# Patient Record
Sex: Female | Born: 1955 | Race: Black or African American | Hispanic: No | Marital: Married | Smoking: Former smoker
Health system: Southern US, Community
[De-identification: ages and names within clinical notes are randomized; demographics above are authoritative.]

## PROBLEM LIST (undated history)

## (undated) ENCOUNTER — Emergency Department (HOSPITAL_BASED_OUTPATIENT_CLINIC_OR_DEPARTMENT_OTHER): Admission: EM | Payer: BLUE CROSS/BLUE SHIELD | Source: Home / Self Care

## (undated) DIAGNOSIS — I2510 Atherosclerotic heart disease of native coronary artery without angina pectoris: Secondary | ICD-10-CM

## (undated) DIAGNOSIS — G4730 Sleep apnea, unspecified: Secondary | ICD-10-CM

## (undated) DIAGNOSIS — J449 Chronic obstructive pulmonary disease, unspecified: Secondary | ICD-10-CM

## (undated) DIAGNOSIS — I10 Essential (primary) hypertension: Secondary | ICD-10-CM

## (undated) HISTORY — PX: LUNG LOBECTOMY: SHX167

## (undated) HISTORY — PX: CORONARY ANGIOPLASTY: SHX604

---

## 2010-06-12 ENCOUNTER — Ambulatory Visit: Payer: Self-pay | Admitting: Obstetrics and Gynecology

## 2011-06-30 ENCOUNTER — Emergency Department (HOSPITAL_BASED_OUTPATIENT_CLINIC_OR_DEPARTMENT_OTHER)
Admission: EM | Admit: 2011-06-30 | Discharge: 2011-06-30 | Disposition: A | Discharge status: Home / Self Care | Payer: Self-pay | Attending: Emergency Medicine | Admitting: Emergency Medicine

## 2011-06-30 ENCOUNTER — Encounter: Payer: Self-pay | Admitting: *Deleted

## 2011-06-30 DIAGNOSIS — J45909 Unspecified asthma, uncomplicated: Secondary | ICD-10-CM | POA: Insufficient documentation

## 2011-06-30 DIAGNOSIS — E1169 Type 2 diabetes mellitus with other specified complication: Secondary | ICD-10-CM | POA: Insufficient documentation

## 2011-06-30 DIAGNOSIS — R739 Hyperglycemia, unspecified: Secondary | ICD-10-CM

## 2011-06-30 DIAGNOSIS — I2510 Atherosclerotic heart disease of native coronary artery without angina pectoris: Secondary | ICD-10-CM | POA: Insufficient documentation

## 2011-06-30 DIAGNOSIS — I10 Essential (primary) hypertension: Secondary | ICD-10-CM | POA: Insufficient documentation

## 2011-06-30 HISTORY — DX: Atherosclerotic heart disease of native coronary artery without angina pectoris: I25.10

## 2011-06-30 HISTORY — DX: Essential (primary) hypertension: I10

## 2011-06-30 LAB — GLUCOSE, CAPILLARY

## 2011-06-30 MED ORDER — INSULIN REGULAR HUMAN 100 UNIT/ML IJ SOLN
INTRAMUSCULAR | Status: AC
  Administered 2011-06-30: 7 [IU] via SUBCUTANEOUS
  Filled 2011-06-30: qty 3

## 2011-06-30 MED ORDER — INSULIN REGULAR HUMAN 100 UNIT/ML IJ SOLN
7.0000 [IU] | Freq: Once | INTRAMUSCULAR | Status: AC
  Administered 2011-06-30: 7 [IU] via SUBCUTANEOUS

## 2011-06-30 NOTE — ED Notes (Signed)
States all day today her blood sugar has been elevated. Lowest reading today was 399. Does not take insulin. Has an appointment with her MD in 3 days.

## 2011-06-30 NOTE — ED Provider Notes (Signed)
History     Chief Complaint  Patient presents with  . Hyperglycemia   Patient is a 55 y.o. female presenting with diabetes problem. The history is provided by the patient.  Diabetes She presents for her initial diabetic visit. She has type 1 diabetes mellitus. The initial diagnosis of diabetes was made 5 months ago. Her disease course has been worsening. There are no hypoglycemic associated symptoms. Associated symptoms include polyuria. (States also mild headache today) There are no hypoglycemic complications. Symptoms are stable. There are no diabetic complications. Current diabetic treatment includes oral agent (dual therapy). She is compliant with treatment all of the time. Her home blood glucose trend is increasing rapidly.  pt came today due to her blood sugar staying high all day and not going down.  States has never been in the 400's before.  However also states that she has been drinking haiwian punch and did not know it had sugar in it.  Past Medical History  Diagnosis Date  . Diabetes mellitus   . Hypertension   . Asthma   . Coronary artery disease     History reviewed. No pertinent past surgical history.  No family history on file.  History  Substance Use Topics  . Smoking status: Current Everyday Smoker -- 0.5 packs/day  . Smokeless tobacco: Not on file  . Alcohol Use: No    OB History    Grav Para Term Preterm Abortions TAB SAB Ect Mult Living                  Review of Systems  Genitourinary: Positive for polyuria.  All other systems reviewed and are negative.    Physical Exam  BP 121/78  Pulse 89  Resp 20  SpO2 95%  Physical Exam  Nursing note and vitals reviewed. Constitutional: She is oriented to person, place, and time. She appears well-developed and well-nourished. No distress.  HENT:  Head: Normocephalic and atraumatic.  Eyes: EOM are normal. Pupils are equal, round, and reactive to light.  Cardiovascular: Normal rate, regular rhythm, normal  heart sounds and intact distal pulses.  Exam reveals no friction rub.   No murmur heard. Pulmonary/Chest: Effort normal and breath sounds normal. She has no wheezes. She has no rales.  Abdominal: Soft. Bowel sounds are normal. She exhibits no distension. There is no tenderness. There is no rebound and no guarding.  Musculoskeletal: Normal range of motion. She exhibits no tenderness.       No edema  Neurological: She is alert and oriented to person, place, and time. No cranial nerve deficit.  Skin: Skin is warm and dry. No rash noted.  Psychiatric: She has a normal mood and affect. Her behavior is normal.    ED Course  Procedures  MDM Pt here due to having elevated BS today over 400 and states despite taking BS meds.  Pt denies any infectious sx, cough or SOB.  No abd pain or vomitting.  VS wnl.  BS here is 360 and when interviewing states that she has been drinking fruit punch which is probably why the BS is elevated today.  Discussed with pt that this has a lot of sugar and she was unaware.  States had a mild HA today but o/w no dysuria or other sx. Will give [place] insulin and recheck sugar.  Pt f/u with PCP in 3 days and may go on insulin at that time.    Repeat CBG improving and no 330.  Will d/d home and have  f/u.  Gwyneth Sprout, MD 06/30/11 [phone]

## 2012-03-13 ENCOUNTER — Emergency Department (HOSPITAL_BASED_OUTPATIENT_CLINIC_OR_DEPARTMENT_OTHER)
Admission: EM | Admit: 2012-03-13 | Discharge: 2012-03-13 | Disposition: A | Discharge status: Home / Self Care | Payer: Self-pay | Attending: Emergency Medicine | Admitting: Emergency Medicine

## 2012-03-13 ENCOUNTER — Other Ambulatory Visit: Payer: Self-pay

## 2012-03-13 ENCOUNTER — Encounter (HOSPITAL_BASED_OUTPATIENT_CLINIC_OR_DEPARTMENT_OTHER): Payer: Self-pay | Admitting: *Deleted

## 2012-03-13 ENCOUNTER — Emergency Department (INDEPENDENT_AMBULATORY_CARE_PROVIDER_SITE_OTHER): Payer: Self-pay

## 2012-03-13 DIAGNOSIS — R059 Cough, unspecified: Secondary | ICD-10-CM | POA: Insufficient documentation

## 2012-03-13 DIAGNOSIS — J45909 Unspecified asthma, uncomplicated: Secondary | ICD-10-CM | POA: Insufficient documentation

## 2012-03-13 DIAGNOSIS — R079 Chest pain, unspecified: Secondary | ICD-10-CM

## 2012-03-13 DIAGNOSIS — R05 Cough: Secondary | ICD-10-CM | POA: Insufficient documentation

## 2012-03-13 DIAGNOSIS — I2510 Atherosclerotic heart disease of native coronary artery without angina pectoris: Secondary | ICD-10-CM | POA: Insufficient documentation

## 2012-03-13 DIAGNOSIS — R0602 Shortness of breath: Secondary | ICD-10-CM

## 2012-03-13 DIAGNOSIS — R911 Solitary pulmonary nodule: Secondary | ICD-10-CM

## 2012-03-13 DIAGNOSIS — R110 Nausea: Secondary | ICD-10-CM | POA: Insufficient documentation

## 2012-03-13 DIAGNOSIS — E119 Type 2 diabetes mellitus without complications: Secondary | ICD-10-CM | POA: Insufficient documentation

## 2012-03-13 DIAGNOSIS — C3490 Malignant neoplasm of unspecified part of unspecified bronchus or lung: Secondary | ICD-10-CM

## 2012-03-13 DIAGNOSIS — R071 Chest pain on breathing: Secondary | ICD-10-CM | POA: Insufficient documentation

## 2012-03-13 DIAGNOSIS — I10 Essential (primary) hypertension: Secondary | ICD-10-CM | POA: Insufficient documentation

## 2012-03-13 DIAGNOSIS — R51 Headache: Secondary | ICD-10-CM

## 2012-03-13 DIAGNOSIS — F17200 Nicotine dependence, unspecified, uncomplicated: Secondary | ICD-10-CM | POA: Insufficient documentation

## 2012-03-13 LAB — DIFFERENTIAL
Basophils Relative: 0 % (ref 0–1)
Eosinophils Absolute: 0.2 10*3/uL (ref 0.0–0.7)
Eosinophils Relative: 2 % (ref 0–5)
Monocytes Absolute: 0.6 10*3/uL (ref 0.1–1.0)
Monocytes Relative: 6 % (ref 3–12)
Neutro Abs: 5.2 10*3/uL (ref 1.7–7.7)

## 2012-03-13 LAB — BASIC METABOLIC PANEL
BUN: 15 mg/dL (ref 6–23)
Chloride: 102 mEq/L (ref 96–112)
Creatinine, Ser: 0.7 mg/dL (ref 0.50–1.10)
GFR calc Af Amer: >90 mL/min (ref >90)

## 2012-03-13 LAB — CBC
HCT: 42.4 % (ref 36.0–46.0)
Hemoglobin: 13.8 g/dL (ref 12.0–15.0)
MCH: 30.1 pg (ref 26.0–34.0)
MCHC: 32.5 g/dL (ref 30.0–36.0)
MCV: 92.6 fL (ref 78.0–100.0)

## 2012-03-13 LAB — CARDIAC PANEL(CRET KIN+CKTOT+MB+TROPI): Relative Index: INVALID (ref 0.0–2.5)

## 2012-03-13 MED ORDER — SULFAMETHOXAZOLE-TRIMETHOPRIM 800-160 MG PO TABS: 1.0000 | ORAL_TABLET | Freq: Two times a day (BID) | ORAL | Status: AC

## 2012-03-13 MED ORDER — ONDANSETRON HCL 4 MG/2ML IJ SOLN
INTRAMUSCULAR | Status: AC
  Filled 2012-03-13: qty 2

## 2012-03-13 MED ORDER — ACETAMINOPHEN 325 MG PO TABS
650.0000 mg | ORAL_TABLET | Freq: Once | ORAL | Status: AC
  Administered 2012-03-13: 650 mg via ORAL
  Filled 2012-03-13: qty 2

## 2012-03-13 MED ORDER — IOHEXOL 350 MG/ML SOLN
100.0000 mL | Freq: Once | INTRAVENOUS | Status: AC | PRN
  Administered 2012-03-13: 100 mL via INTRAVENOUS

## 2012-03-13 MED ORDER — SODIUM CHLORIDE 0.9 % IV SOLN
8.0000 mg | Freq: Once | INTRAVENOUS | Status: DC
  Filled 2012-03-13: qty 2

## 2012-03-13 NOTE — ED Notes (Signed)
Explained delay to pt.  Lab reported to this RN earlier that BMP sample hemolyzed.  RN inadvertently did not re-draw lab.  CT on hold until creatinine resulted.  BMP re-drawn at this time.  NP also made aware.

## 2012-03-13 NOTE — ED Notes (Signed)
Pt states she began having CP last week, but it got worse last night. Radiates into back and she also had some nausea and SHOB. BBS diminished. Prod cough with clear mucous. Hx bronchitis

## 2012-03-13 NOTE — Discharge Instructions (Signed)
Cancer, General Information °Cancer is a group of many related diseases that begin in cells. Cells are the building blocks of the body. Cells are also the basic unit of life. The body is made up of many types of cells. Normally, cells grow and divide to produce more cells only when the body needs them. This orderly process helps keep the body healthy. Sometimes cells keep dividing when new cells are not needed. These extra cells may form a mass of tissue. It is called a growth or tumor. Tumors can be either: °· Not cancerous (benign).  °· Cancerous (malignant).  °Cancer can begin in any organ or tissue of the body. The original tumor is where the tumor started out. It is called the primary cancer or primary tumor. Usually it is named for the part of the body in which it begins. °Metastases mean the spread of cancer. Cancer cells can break away from a primary tumor and travel locally. These grow next to the tumor. Cancer cells can also travel through the bloodstream or lymphatic system to other parts of the body. Cancer cells may spread to lymph nodes near the primary tumor regional lymph nodes, so called because they are in the region. This is called: °· Nodal involvement.  °· Positive nodes.  °· Regional disease.  °Cancer cells can also spread to other parts of the body, distant from the primary tumor. Doctors use the term metastatic disease or distant disease to describe cancer that spreads to other organs or to lymph nodes other than those near the primary tumor. °When cancer cells spread and form a new tumor, the new tumor is called a secondary, or metastatic, tumor. The cancer cells that form the secondary tumor are like those in the original tumor. That means, for example, that if breast cancer spreads (metastasizes) to the lung, the secondary tumor is made up of abnormal breast cells. It is not made of abnormal lung cells. The disease in the lung is metastatic breast cancer. It is not lung cancer. °A  metastasis is a tumor that started from a cancer cell or cells in another part of the body. But sometimes a primary cancer is discovered only after a metastasis causes problems (symptoms). For example, a man whose prostate cancer has spread to the bones in the pelvis may have lower back pain (caused by the cancer in his bones) before experiencing any symptoms from the prostate tumor itself. °The cells in a metastatic tumor resemble those in the primary tumor. The cancerous tissue is examined under a microscope to determine the cell type. Then a doctor can usually tell whether that type of cell is normally found in the part of the body from which the tissue sample was taken. °For instance, breast cancer cells look the same whether they are found in the breast or have spread to another part of the body. So, if a tissue sample taken from a tumor in the lung contains cells that look like breast cells, the doctor determines that the lung tumor is a secondary tumor. Metastatic cancers may be found at the same time as the primary tumor, or months or years later. When a second tumor is found in a patient who has been treated for cancer in the past, it is more often a metastasis than another primary tumor. In a small number of cancer patients, a secondary tumor is diagnosed, but no primary cancer can be found, in spite of extensive tests. Doctors refer to the primary tumor as   unknown or occult. The patient is said tohave cancer of unknown primary origin (CUP). This means we do not know where the tumor came from. TREATMENT  When cancer has metastasized, it may be treated with:  Chemotherapy.   Radiation therapy.   Biologic therapy.   Hormone therapy.   Surgery.   A combination of these.  The choice of treatment generally depends on the:  Type of primary cancer.   Size and location of the metastasis.   Patient's age and general health.   Types of treatments used previously.  In patients diagnosed with  CUP, it is still possible to treat the disease even when the primary tumor cannot be located. It is also possible to treat the cancer even if the organ from which the cancer cells came cannot be identified. New cancer treatments are currently under study. To develop new treatments, the [employer] ([employer]) sponsors clinical trials (research studies) with cancer patients in many hospitals, universities, medical schools, and cancer centers around the country. Clinical trials are a critical step in the improvement of treatment. Before any new treatment can be recommended for general use, doctors conduct studies to find out whether the treatment is both safe for patients and effective against the disease. The results of such studies have led to progress not only in the treatment of cancer, but in the detection, diagnosis, improvement of survival rates, and prevention of the disease. Patients interested in participating in research should ask their doctor to find out whether they are eligible for a clinical trial. FOR MORE INFORMATION http://www.cancer.gov Document Released: 12/07/2005 Document Revised: 11/26/2011 Document Reviewed: 08/25/2007 [facility] Patient Information 2012 [place], [place].  Lung Cancer Lung cancer is a tumor which starts as a growth in your lungs. Cancer is a group of many related diseases that begin in cells, the building blocks of the body. Normally, cells grow and divide to produce more cells only when the body needs them. Sometimes cells keep dividing when new cells are not needed. These extra cells may form a mass of tissue called a growth or tumor. Tumors can be either benign (not cancerous) or malignant (cancerous). Cancer can begin in any organ or tissue of the body. The original tumor (where the tumor started out) is called the primary cancer and is usually named for where it begins.  Lung cancer is the most common cause of cancer death in men and women. There are several  different types of lung cancers. Usually, lung cancer is described as either small-cell lung cancer or non-small-cell lung cancer. Other types of cancer occur in the lungs, including carcinoid and cancers spread from other organs. The types of cancer have different behavior and treatment. CAUSES  This cancer usually starts when the lungs are exposed to harmful chemicals. When you quit smoking, your risk of lung cancer falls each year (but is never the same as a person who has never smoked).  Other risks include:   Radon gas exposure.   Asbestos and other industrial substance exposure.   Second hand tobacco smoke.   Air pollution.   Family or personal history of lung cancer.   Age over 31.  SYMPTOMS  Lung cancer can cause many symptoms. They depend on the type of cancer, its location and other factors. Symptoms of lung cancer can include:  Cough (either new, different or more severe).   Shortness of breath.   Coughing up blood (hemoptysis).   Chest pain.   Hoarseness.   Swelling of the  face.   Drooping eyelid.   Changes in blood tests: low sodium (hyponatremia), high calcium (hypercalcemia) or low blood count (anemia).   Weight loss.  In its early stages, lung cancer may not have symptoms and can be discovered by accident. Many of the symptoms above can be caused by diseases other than lung cancer. DIAGNOSIS  In early lung cancer, the patient often does not notice problems.  Common tests that help your caregiver diagnose your condition include:  Chest x-ray.   CT scan of the lungs and chest.   Blood tests.  If a tumor is found, a biopsy will be necessary to confirm that cancer is present and to determine the type of cancer. TREATMENT   Surgery offers a hope for a cure if the cancer has not spread and the cancer is not a small cell (oat cell) cancer of the lung. Surgery cannot cure the small cell type of cancer.   Radiation Therapy is a form of high energy X-ray that  helps slow or kill the cancer. It is often used along with medications (chemotherapy) to help treat the cancer and control pain.   Chemotherapy is used in combination with surgery in advanced cancer. It is also used in all small cell cancers.   Many new treatments look promising.   Your caregiver can give you more information and discuss treatment options that are best for your type of cancer.  HOME CARE INSTRUCTIONS   If you smoke, stop!   Take all medications as told.   Keep all appointments with your caregiver and other specialists.   Ask your caregiver if you should see a cancer specialist, if that has not been arranged.   If you require oxygen or breathing equipment, be sure you know how to use it and who to call with questions.   Follow any special diet directions. If you have problems with appetite, ask your caregiver for help.  SEEK MEDICAL CARE IF:   You have had a surgical procedure are you are having trouble recovering.   You have ongoing weight loss.   You have decreased strength or energy past the point when your caregiver said you would feel better.   You develop nausea or lightheadedness.   You have pain that is not improving.  SEEK IMMEDIATE MEDICAL CARE IF:   You cough up clotted blood or bright red blood.   Your pain is uncontrolled.   You develop new difficulty breathing or chest pain.   You develop swelling in one or both ankles or legs, or swelling in your face or neck.   You develop new headache or confusion.  Document Released: 03/15/2001 Document Revised: 11/26/2011 Document Reviewed: 12/24/2008 [facility] Patient Information 2012 [place], [place].   Call your provider at the adult health center tomorrow to schedule a follow-up appointment in the next two weeks  Bronchitis Bronchitis is the body's way of reacting to injury and/or infection (inflammation) of the bronchi. Bronchi are the air tubes that extend from the windpipe into the lungs. If the  inflammation becomes severe, it may cause shortness of breath. CAUSES  Inflammation may be caused by:  A virus.   Germs (bacteria).   Dust.   Allergens.   Pollutants and many other irritants.  The cells lining the bronchial tree are covered with tiny hairs (cilia). These constantly beat upward, away from the lungs, toward the mouth. This keeps the lungs free of pollutants. When these cells become too irritated and are unable to do  their job, mucus begins to develop. This causes the characteristic cough of bronchitis. The cough clears the lungs when the cilia are unable to do their job. Without either of these protective mechanisms, the mucus would settle in the lungs. Then you would develop pneumonia. Smoking is a common cause of bronchitis and can contribute to pneumonia. Stopping this habit is the single most important thing you can do to help yourself. TREATMENT   Your caregiver may prescribe an antibiotic if the cough is caused by bacteria. Also, medicines that open up your airways make it easier to breathe. Your caregiver may also recommend or prescribe an expectorant. It will loosen the mucus to be coughed up. Only take over-the-counter or prescription medicines for pain, discomfort, or fever as directed by your caregiver.   Removing whatever causes the problem (smoking, for example) is critical to preventing the problem from getting worse.   Cough suppressants may be prescribed for relief of cough symptoms.   Inhaled medicines may be prescribed to help with symptoms now and to help prevent problems from returning.   For those with recurrent (chronic) bronchitis, there may be a need for steroid medicines.  SEEK IMMEDIATE MEDICAL CARE IF:   During treatment, you develop more pus-like mucus (purulent sputum).   You have a fever.   Your baby is older than 3 months with a rectal temperature of 102 F (38.9 C) or higher.   Your baby is 47 months old or younger with a rectal  temperature of 100.4 F (38 C) or higher.   You become progressively more ill.   You have increased difficulty breathing, wheezing, or shortness of breath.  It is necessary to seek immediate medical care if you are elderly or sick from any other disease. MAKE SURE YOU:   Understand these instructions.   Will watch your condition.   Will get help right away if you are not doing well or get worse.  Document Released: 12/07/2005 Document Revised: 11/26/2011 Document Reviewed: 10/16/2008 [facility] Patient Information 2012 [place], [place].Marland Kitchen

## 2012-03-13 NOTE — ED Provider Notes (Signed)
Medical screening examination/treatment/procedure(s) were conducted as a shared visit with non-physician practitioner(s) and myself.  I personally evaluated the patient during the encounter  Patient seen by me chest x-ray very concerning for bronchogenic carcinoma patient does have followup available at [facility] she is going to call tomorrow if she runs into difficulty she will return here. Patient is further evaluation of this sometime over the next 2 weeks.  Shelda Jakes, MD 03/13/12 1754

## 2012-03-13 NOTE — ED Provider Notes (Signed)
History     CSN: [id number]  Arrival date & time 03/13/12  1255   First MD Initiated Contact with Patient 03/13/12 1412      Chief Complaint  Patient presents with  . Chest Pain    (Consider location/radiation/quality/duration/timing/severity/associated sxs/prior treatment) Patient is a 56 y.o. female presenting with chest pain. The history is provided by the patient. No language interpreter was used.  Chest Pain The chest pain began 5 - 7 days ago. Chest pain occurs intermittently. The chest pain is unchanged. The pain is associated with breathing and coughing. The severity of the pain is moderate. The quality of the pain is described as pleuritic. The pain radiates to the upper back. Chest pain is worsened by deep breathing. Primary symptoms include cough and nausea. Pertinent negatives for primary symptoms include no fever, no shortness of breath and no vomiting.  Pertinent negatives for associated symptoms include no orthopnea and no weakness. Risk factors include smoking/tobacco exposure.  Her past medical history is significant for diabetes.     Past Medical History  Diagnosis Date  . Diabetes mellitus   . Hypertension   . Asthma   . Coronary artery disease     History reviewed. No pertinent past surgical history.  History reviewed. No pertinent family history.  History  Substance Use Topics  . Smoking status: Current Everyday Smoker -- 0.5 packs/day  . Smokeless tobacco: Not on file  . Alcohol Use: No    OB History    Grav Para Term Preterm Abortions TAB SAB Ect Mult Living                  Review of Systems  Constitutional: Negative for fever.  Respiratory: Positive for cough. Negative for shortness of breath.   Cardiovascular: Positive for chest pain. Negative for orthopnea.  Gastrointestinal: Positive for nausea. Negative for vomiting.  Neurological: Negative for weakness.  All other systems reviewed and are negative.    Allergies  Review of  patient's allergies indicates no known allergies.  Home Medications   Current Outpatient Rx  Name Route Sig Dispense Refill  . ASPIRIN 81 MG PO TBEC Oral Take 81 mg by mouth daily.      [place] IN Inhalation Inhale 2 puffs into the lungs as needed. For shortness of breath     . CARVEDILOL PO Oral Take 1 tablet by mouth daily.      Marland Kitchen NEXIUM PO Oral Take 1 capsule by mouth daily.      Marland Kitchen GLIPIZIDE PO Oral Take 1 tablet by mouth daily.      Marland Kitchen LISINOPRIL PO Oral Take 1 tablet by mouth daily.      Marland Kitchen METFORMIN HCL PO Oral Take 2 tablets by mouth daily.        BP 142/82  Pulse 90  Temp(Src) 97.8 F (36.6 C) (Oral)  Resp 20  Ht 5\' 9"  (1.753 m)  Wt 222 lb (100.699 kg)  BMI 32.78 kg/m2  SpO2 95%  Physical Exam  Nursing note and vitals reviewed. Constitutional: She is oriented to person, place, and time. She appears well-developed and well-nourished. No distress.  HENT:  Head: Normocephalic.  Mouth/Throat: Oropharynx is clear and moist.  Eyes: Conjunctivae are normal. Pupils are equal, round, and reactive to light.  Neck: Normal range of motion. Neck supple.  Cardiovascular: Normal rate, regular rhythm, normal heart sounds and intact distal pulses.   Pulmonary/Chest: Effort normal.  Abdominal: Soft. Bowel sounds are normal.  Musculoskeletal: Normal range of  motion.  Lymphadenopathy:    She has no cervical adenopathy.  Neurological: She is alert and oriented to person, place, and time.  Skin: Skin is warm and dry.  Psychiatric: She has a normal mood and affect. Her behavior is normal. Judgment and thought content normal.    ED Course  Procedures (including critical care time)  Date: 03/13/2012  Rate: 84  Rhythm: normal sinus rhythm  QRS Axis: normal  Intervals: normal  ST/T Wave abnormalities: normal  Conduction Disutrbances:none  Narrative Interpretation:  NSR without ectopy--no ischemic changes  Old EKG Reviewed: none available   Labs Reviewed  DIFFERENTIAL -  Abnormal; Notable for the following:    Lymphs Abs 4.2 (*)    All other components within normal limits  CBC  BASIC METABOLIC PANEL  CARDIAC PANEL(CRET KIN+CKTOT+MB+TROPI)  BASIC METABOLIC PANEL  CARDIAC PANEL(CRET KIN+CKTOT+MB+TROPI)   No results found.   No diagnosis found.  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  Results discussed with patient and family.  Patient is followed by the adult health clinic in [facility].  Patient will call tomorrow to establish follow-up within the next two weeks.    Chronic cough. Likely d/t bronchitis.  Will treat with bactrim.  MDM          Jimmye Norman, NP 03/13/12 [phone]

## 2012-03-24 NOTE — ED Provider Notes (Signed)
Medical screening examination/treatment/procedure(s) were conducted as a shared visit with non-physician practitioner(s) and myself.  I personally evaluated the patient during the encounter  Shelda Jakes, MD 03/24/12 [phone]

## 2016-10-13 ENCOUNTER — Emergency Department (HOSPITAL_COMMUNITY): Payer: BLUE CROSS/BLUE SHIELD

## 2016-10-13 ENCOUNTER — Inpatient Hospital Stay (HOSPITAL_COMMUNITY)
Admission: EM | Admit: 2016-10-13 | Discharge: 2016-10-22 | DRG: 286 | Disposition: A | Discharge status: Home / Self Care | Payer: BLUE CROSS/BLUE SHIELD | Attending: Internal Medicine | Admitting: Internal Medicine

## 2016-10-13 ENCOUNTER — Encounter (HOSPITAL_COMMUNITY): Payer: Self-pay | Admitting: *Deleted

## 2016-10-13 DIAGNOSIS — J441 Chronic obstructive pulmonary disease with (acute) exacerbation: Secondary | ICD-10-CM | POA: Diagnosis present

## 2016-10-13 DIAGNOSIS — I2609 Other pulmonary embolism with acute cor pulmonale: Secondary | ICD-10-CM

## 2016-10-13 DIAGNOSIS — I272 Pulmonary hypertension, unspecified: Secondary | ICD-10-CM

## 2016-10-13 DIAGNOSIS — I110 Hypertensive heart disease with heart failure: Principal | ICD-10-CM | POA: Diagnosis present

## 2016-10-13 DIAGNOSIS — Z7982 Long term (current) use of aspirin: Secondary | ICD-10-CM

## 2016-10-13 DIAGNOSIS — K259 Gastric ulcer, unspecified as acute or chronic, without hemorrhage or perforation: Secondary | ICD-10-CM | POA: Diagnosis present

## 2016-10-13 DIAGNOSIS — Z6836 Body mass index (BMI) 36.0-36.9, adult: Secondary | ICD-10-CM

## 2016-10-13 DIAGNOSIS — R918 Other nonspecific abnormal finding of lung field: Secondary | ICD-10-CM

## 2016-10-13 DIAGNOSIS — I2721 Secondary pulmonary arterial hypertension: Secondary | ICD-10-CM | POA: Diagnosis present

## 2016-10-13 DIAGNOSIS — G8929 Other chronic pain: Secondary | ICD-10-CM | POA: Diagnosis present

## 2016-10-13 DIAGNOSIS — I2781 Cor pulmonale (chronic): Secondary | ICD-10-CM | POA: Diagnosis present

## 2016-10-13 DIAGNOSIS — E119 Type 2 diabetes mellitus without complications: Secondary | ICD-10-CM

## 2016-10-13 DIAGNOSIS — R0902 Hypoxemia: Secondary | ICD-10-CM

## 2016-10-13 DIAGNOSIS — E785 Hyperlipidemia, unspecified: Secondary | ICD-10-CM | POA: Diagnosis present

## 2016-10-13 DIAGNOSIS — I10 Essential (primary) hypertension: Secondary | ICD-10-CM | POA: Diagnosis present

## 2016-10-13 DIAGNOSIS — E662 Morbid (severe) obesity with alveolar hypoventilation: Secondary | ICD-10-CM | POA: Diagnosis present

## 2016-10-13 DIAGNOSIS — Z809 Family history of malignant neoplasm, unspecified: Secondary | ICD-10-CM

## 2016-10-13 DIAGNOSIS — I5082 Biventricular heart failure: Secondary | ICD-10-CM | POA: Diagnosis present

## 2016-10-13 DIAGNOSIS — E1169 Type 2 diabetes mellitus with other specified complication: Secondary | ICD-10-CM | POA: Diagnosis present

## 2016-10-13 DIAGNOSIS — J449 Chronic obstructive pulmonary disease, unspecified: Secondary | ICD-10-CM

## 2016-10-13 DIAGNOSIS — J9621 Acute and chronic respiratory failure with hypoxia: Secondary | ICD-10-CM | POA: Diagnosis present

## 2016-10-13 DIAGNOSIS — IMO0001 Reserved for inherently not codable concepts without codable children: Secondary | ICD-10-CM

## 2016-10-13 DIAGNOSIS — I5031 Acute diastolic (congestive) heart failure: Secondary | ICD-10-CM | POA: Diagnosis present

## 2016-10-13 DIAGNOSIS — I2510 Atherosclerotic heart disease of native coronary artery without angina pectoris: Secondary | ICD-10-CM | POA: Diagnosis present

## 2016-10-13 DIAGNOSIS — F17210 Nicotine dependence, cigarettes, uncomplicated: Secondary | ICD-10-CM | POA: Diagnosis present

## 2016-10-13 DIAGNOSIS — R1084 Generalized abdominal pain: Secondary | ICD-10-CM | POA: Diagnosis not present

## 2016-10-13 DIAGNOSIS — K279 Peptic ulcer, site unspecified, unspecified as acute or chronic, without hemorrhage or perforation: Secondary | ICD-10-CM

## 2016-10-13 DIAGNOSIS — R109 Unspecified abdominal pain: Secondary | ICD-10-CM | POA: Diagnosis present

## 2016-10-13 DIAGNOSIS — Z9119 Patient's noncompliance with other medical treatment and regimen: Secondary | ICD-10-CM

## 2016-10-13 DIAGNOSIS — Z8249 Family history of ischemic heart disease and other diseases of the circulatory system: Secondary | ICD-10-CM

## 2016-10-13 DIAGNOSIS — I5033 Acute on chronic diastolic (congestive) heart failure: Secondary | ICD-10-CM | POA: Diagnosis present

## 2016-10-13 DIAGNOSIS — Z955 Presence of coronary angioplasty implant and graft: Secondary | ICD-10-CM

## 2016-10-13 DIAGNOSIS — I50810 Right heart failure, unspecified: Secondary | ICD-10-CM

## 2016-10-13 DIAGNOSIS — E1165 Type 2 diabetes mellitus with hyperglycemia: Secondary | ICD-10-CM | POA: Diagnosis present

## 2016-10-13 DIAGNOSIS — Z794 Long term (current) use of insulin: Secondary | ICD-10-CM

## 2016-10-13 DIAGNOSIS — G4733 Obstructive sleep apnea (adult) (pediatric): Secondary | ICD-10-CM

## 2016-10-13 DIAGNOSIS — Z599 Problem related to housing and economic circumstances, unspecified: Secondary | ICD-10-CM

## 2016-10-13 DIAGNOSIS — Z833 Family history of diabetes mellitus: Secondary | ICD-10-CM

## 2016-10-13 DIAGNOSIS — Z9981 Dependence on supplemental oxygen: Secondary | ICD-10-CM

## 2016-10-13 HISTORY — DX: Sleep apnea, unspecified: G47.30

## 2016-10-13 HISTORY — DX: Chronic obstructive pulmonary disease, unspecified: J44.9

## 2016-10-13 LAB — COMPREHENSIVE METABOLIC PANEL
ALT: 23 U/L (ref 14–54)
ANION GAP: 10 (ref 5–15)
AST: 23 U/L (ref 15–41)
Albumin: 3.1 g/dL — ABNORMAL LOW (ref 3.5–5.0)
Alkaline Phosphatase: 72 U/L (ref 38–126)
BILIRUBIN TOTAL: 0.7 mg/dL (ref 0.3–1.2)
BUN: 7 mg/dL (ref 6–20)
CO2: 24 mmol/L (ref 22–32)
Calcium: 9.1 mg/dL (ref 8.9–10.3)
Chloride: 105 mmol/L (ref 101–111)
Creatinine, Ser: 0.76 mg/dL (ref 0.44–1.00)
GFR calc Af Amer: >60 mL/min (ref >60)
Glucose, Bld: 213 mg/dL — ABNORMAL HIGH (ref 65–99)
POTASSIUM: 4.1 mmol/L (ref 3.5–5.1)
Sodium: 139 mmol/L (ref 135–145)
TOTAL PROTEIN: 6.3 g/dL — AB (ref 6.5–8.1)

## 2016-10-13 LAB — CBC
HEMATOCRIT: 40.7 % (ref 36.0–46.0)
HEMOGLOBIN: 12.1 g/dL (ref 12.0–15.0)
MCH: 26.2 pg (ref 26.0–34.0)
MCHC: 29.7 g/dL — ABNORMAL LOW (ref 30.0–36.0)
MCV: 88.1 fL (ref 78.0–100.0)
Platelets: 254 10*3/uL (ref 150–400)
RBC: 4.62 MIL/uL (ref 3.87–5.11)
RDW: 18.3 % — AB (ref 11.5–15.5)
WBC: 9.9 10*3/uL (ref 4.0–10.5)

## 2016-10-13 LAB — I-STAT ARTERIAL BLOOD GAS, ED
Acid-Base Excess: 2 mmol/L (ref 0.0–2.0)
BICARBONATE: 27.2 mmol/L (ref 20.0–28.0)
O2 SAT: 90 %
PCO2 ART: 45.3 mmHg (ref 32.0–48.0)
TCO2: 29 mmol/L (ref 0–100)
pH, Arterial: 7.389 (ref 7.350–7.450)
pO2, Arterial: 60 mmHg — ABNORMAL LOW (ref 83.0–108.0)

## 2016-10-13 LAB — I-STAT TROPONIN, ED: Troponin i, poc: 0 ng/mL (ref 0.00–0.08)

## 2016-10-13 LAB — LIPASE, BLOOD: Lipase: 22 U/L (ref 11–51)

## 2016-10-13 LAB — D-DIMER, QUANTITATIVE: D-Dimer, Quant: 1.15 ug/mL-FEU — ABNORMAL HIGH (ref 0.00–0.50)

## 2016-10-13 LAB — BRAIN NATRIURETIC PEPTIDE: B NATRIURETIC PEPTIDE 5: 328.2 pg/mL — AB (ref 0.0–100.0)

## 2016-10-13 MED ORDER — FUROSEMIDE 10 MG/ML IJ SOLN
40.0000 mg | Freq: Once | INTRAMUSCULAR | Status: AC
  Administered 2016-10-14: 40 mg via INTRAVENOUS
  Filled 2016-10-13: qty 4

## 2016-10-13 MED ORDER — LIDOCAINE VISCOUS 2 % MT SOLN
15.0000 mL | Freq: Once | OROMUCOSAL | Status: AC
  Administered 2016-10-13: 15 mL via OROMUCOSAL
  Filled 2016-10-13: qty 15

## 2016-10-13 MED ORDER — IOPAMIDOL (ISOVUE-370) INJECTION 76%
INTRAVENOUS | Status: AC
  Administered 2016-10-13: 100 mL
  Filled 2016-10-13: qty 100

## 2016-10-13 MED ORDER — GI COCKTAIL ~~LOC~~
30.0000 mL | Freq: Once | ORAL | Status: AC
  Administered 2016-10-13: 30 mL via ORAL
  Filled 2016-10-13: qty 30

## 2016-10-13 MED ORDER — IPRATROPIUM-ALBUTEROL 0.5-2.5 (3) MG/3ML IN SOLN
3.0000 mL | RESPIRATORY_TRACT | Status: DC | PRN
  Administered 2016-10-13: 3 mL via RESPIRATORY_TRACT
  Filled 2016-10-13: qty 3

## 2016-10-13 NOTE — ED Provider Notes (Signed)
[facility] DEPT Provider Note   CSN: [id number] Arrival date & time: 10/13/16  1643     History   Chief Complaint Chief Complaint  Patient presents with  . Abdominal Pain    HPI Nancy Castillo is a 60 y.o. female with a hx of severe COPD on 4L of O2 by [place] at all times, CAD, DM, HTN, and chronic abdominal pain presents to the ED noting 9-10 months who has had recent EGD and was found to have ulcers, taking carafate and omeprazole to no avail of her sx. She notes no nausea, vomiting, diarrhea, or blood in her stool with it. She presented to the ED for further evaluation of her symptoms. She denies any new aspects of her sx, only persistence.   Additionally, the patient states that her breathing feels a bit shorter than it normally does. She denies any productive cough, fever, chills, chest pain, chest tightness. She states that she has been compliant with her rx'd medications.    HPI  Past Medical History:  Diagnosis Date  . Asthma   . Coronary artery disease   . Diabetes mellitus   . Hypertension     Patient Active Problem List   Diagnosis Date Noted  . Respiratory failure with hypoxia ([place]) 10/14/2016    History reviewed. No pertinent surgical history.  OB History    No data available       Home Medications    Prior to Admission medications   Medication Sig Start Date End Date Taking? Authorizing Provider  albuterol (PROVENTIL HFA;VENTOLIN HFA) 108 (90 Base) MCG/ACT inhaler Inhale 2 puffs into the lungs every 4 (four) hours as needed for wheezing or shortness of breath.  08/04/16  Yes Historical Provider, MD  aspirin 81 MG EC tablet Take 81 mg by mouth daily.     Yes Historical Provider, MD  carvedilol (COREG) 3.125 MG tablet Take 3.125 mg by mouth 2 (two) times daily with a meal.   Yes Historical Provider, MD  Insulin Detemir ([name] FLEXPEN) 100 UNIT/ML Pen Inject 50 Units into the skin 2 (two) times daily.   Yes Historical Provider, MD  insulin lispro  (HUMALOG KWIKPEN) 100 UNIT/ML KiwkPen Inject 20 Units into the skin 2 (two) times daily.   Yes Historical Provider, MD  lactulose, encephalopathy, (GENERLAC) 10 GM/15ML SOLN Take 10 g by mouth 2 (two) times daily as needed (for constipation).   Yes Historical Provider, MD  linaclotide (LINZESS) 290 MCG CAPS capsule Take 290 mcg by mouth daily before breakfast.   Yes Historical Provider, MD  lisinopril (PRINIVIL,ZESTRIL) 5 MG tablet Take 5 mg by mouth daily.   Yes Historical Provider, MD  omeprazole (PRILOSEC) 40 MG capsule Take 40 mg by mouth daily.   Yes Historical Provider, MD  oxyCODONE-acetaminophen (PERCOCET/ROXICET) 5-325 MG tablet Take 0.5-1 tablets by mouth every 8 (eight) hours as needed (for pain).    Yes Historical Provider, MD  pantoprazole (PROTONIX) 40 MG tablet Take 40 mg by mouth daily. 10/04/16 11/03/16 Yes Historical Provider, MD  polyethylene glycol (MIRALAX / GLYCOLAX) packet Take 17 g by mouth daily as needed for constipation. 10/04/16 11/03/16 Yes Historical Provider, MD  simethicone (MYLICON) 80 MG chewable tablet Chew 80 mg by mouth every 6 (six) hours as needed for flatulence. 10/04/16 11/03/16 Yes Historical Provider, MD  simvastatin (ZOCOR) 40 MG tablet Take 40 mg by mouth daily.   Yes Historical Provider, MD  sitaGLIPtin-metformin ([name]) 50-1000 MG tablet Take 1 tablet by mouth every morning.   Yes  Historical Provider, MD  sucralfate (CARAFATE) 1 g tablet Take 1 g by mouth 3 (three) times daily.    Yes Historical Provider, MD  Vitamin D, Ergocalciferol, (DRISDOL) 50000 units CAPS capsule Take 50,000 Units by mouth every 7 (seven) days.   Yes Historical Provider, MD  Fluticasone-Salmeterol (ADVAIR DISKUS) 250-50 MCG/DOSE AEPB Inhale 1 puff into the lungs 2 (two) times daily.    Historical Provider, MD  furosemide (LASIX) 20 MG tablet Take 20 mg by mouth daily as needed for fluid or edema.    Historical Provider, MD  umeclidinium bromide (INCRUSE ELLIPTA) 62.5 MCG/INH AEPB  Inhale 1 puff into the lungs daily. 10/04/16 11/03/16  Historical Provider, MD    Family History History reviewed. No pertinent family history.  Social History Social History  Substance Use Topics  . Smoking status: Current Every Day Smoker    Packs/day: 0.50  . Smokeless tobacco: Not on file  . Alcohol use No     Allergies   Review of patient's allergies indicates no known allergies.   Review of Systems Review of Systems  Constitutional: Positive for activity change. Negative for appetite change, chills and fever.  HENT: Negative for congestion, rhinorrhea, sinus pressure, sneezing and sore throat.   Respiratory: Positive for shortness of breath. Negative for cough, chest tightness and wheezing.   Cardiovascular: Negative for chest pain and leg swelling.  Gastrointestinal: Positive for abdominal pain. Negative for diarrhea, nausea and vomiting.  Genitourinary: Negative for dysuria, frequency and urgency.  Musculoskeletal: Negative for back pain and neck pain.  Skin: Negative for rash.  Neurological: Negative for dizziness, syncope, light-headedness and headaches.  All other systems reviewed and are negative.    Physical Exam Updated Vital Signs BP 129/90   Pulse 113   Temp 99.5 F (37.5 C) (Rectal)   Resp 26   SpO2 95%   Physical Exam  Constitutional: She is oriented to person, place, and time. She appears well-developed and well-nourished. No distress.  HENT:  Head: Normocephalic and atraumatic.  Nose: Nose normal.  Mouth/Throat: Oropharynx is clear and moist.  Eyes: Conjunctivae and EOM are normal. Pupils are equal, round, and reactive to light.  Neck: Neck supple.  Cardiovascular: Regular rhythm, normal heart sounds and intact distal pulses.  Tachycardia present.   Pulmonary/Chest: Effort normal. She has rales.  Abdominal: Soft. She exhibits no distension. There is no tenderness. There is no rebound and no guarding.  Musculoskeletal: She exhibits no edema or  tenderness.  Neurological: She is alert and oriented to person, place, and time. No cranial nerve deficit. Coordination normal.  Skin: Skin is warm and dry. No rash noted. She is not diaphoretic.  Nursing note and vitals reviewed.    ED Treatments / Results  Labs (all labs ordered are listed, but only abnormal results are displayed) Labs Reviewed  COMPREHENSIVE METABOLIC PANEL - Abnormal; Notable for the following:       Result Value   Glucose, Bld 213 (*)    Total Protein 6.3 (*)    Albumin 3.1 (*)    All other components within normal limits  CBC - Abnormal; Notable for the following:    MCHC 29.7 (*)    RDW 18.3 (*)    All other components within normal limits  BRAIN NATRIURETIC PEPTIDE - Abnormal; Notable for the following:    B Natriuretic Peptide 328.2 (*)    All other components within normal limits  D-DIMER, QUANTITATIVE (NOT AT [facility]) - Abnormal; Notable for the following:  D-Dimer, Quant 1.15 (*)    All other components within normal limits  I-STAT ARTERIAL BLOOD GAS, ED - Abnormal; Notable for the following:    pO2, Arterial 60.0 (*)    All other components within normal limits  LIPASE, BLOOD  URINALYSIS, ROUTINE W REFLEX MICROSCOPIC (NOT AT [facility])  I-STAT TROPOININ, ED    EKG  EKG Interpretation  Date/Time:  Tuesday October 13 2016 17:09:20 EDT Ventricular Rate:  106 PR Interval:    QRS Duration: 92 QT Interval:  363 QTC Calculation: 482 R Axis:   12 Text Interpretation:  Sinus tachycardia Borderline T abnormalities, anterior leads SINCE LAST TRACING HEART RATE HAS INCREASED Confirmed by Winfred Leeds  MD, SAM [phone]) on 10/13/2016 6:16:09 PM       Radiology Ct Angio Chest Pe W And/or Wo Contrast  Result Date: 10/13/2016 CLINICAL DATA:  Initial evaluation for increased oxygen demand, hypoxia. EXAM: CT ANGIOGRAPHY CHEST WITH CONTRAST TECHNIQUE: Multidetector CT imaging of the chest was performed using the standard protocol during bolus administration of  intravenous contrast. Multiplanar CT image reconstructions and MIPs were obtained to evaluate the vascular anatomy. CONTRAST:  90 cc of Isovue 370. COMPARISON:  Prior CT from 09/20/2016 as well as previous radiograph from earlier same day. FINDINGS: Cardiovascular: Intrathoracic aorta a grossly normal and of normal caliber. No definite aortic abnormality, although evaluation limited due to timing of the contrast bolus. Cardiomegaly is stable from prior. Right ventricular and right atrial enlargement with straightening of the intraventricular septum. No pericardial effusion. Pulmonary arterial tree adequately opacified for evaluation. Main pulmonary artery mildly dilated to 3.2 cm and transaxial diameter. No filling defect to suggest acute pulmonary embolism identified. Re-formatted imaging confirms these findings. From the data bowing for possible small distal emboli somewhat limited on CT moved respiratory motion artifact and body habitus. Mediastinum/Nodes: Visualized thyroid is normal. Scattered enlarged mediastinal lymph nodes measure up to approximately 2 cm in short access, not significantly changed relative to recent CT. Prevascular node measures 15 mm, stable. Right hilar node measures 14 mm. Soft tissue fullness at the left hilum with 16 mm left hilar node also similar. Mildly prominent left axillary node measures up to 11 mm, similar to previous. No right axillary adenopathy. Esophagus within normal limits. Lungs/Pleura: Small layering bilateral pleural effusions. Diffuse interlobular septal thickening suggestive of pulmonary edema. 5 mm right upper lobe nodule is stable (series 6, image 32). Additional 10 x 9 mm nodular density within the posterior left upper lobe also stable (series 6, image 34). Unchanged 5 mm subpleural left upper lobe nodule (series 6, image 42). No focal infiltrates. Linear atelectasis within the right middle lobe and lingula. No pneumothorax. Upper Abdomen: Visualized upper abdomen  within normal limits. Musculoskeletal: No acute osseous abnormality. No worrisome lytic or blastic osseous lesions. Review of the MIP images confirms the above findings. IMPRESSION: 1. No CT evidence for acute pulmonary embolism. 2. Diffuse interlobular septal thickening, suggesting mild pulmonary interstitial edema. Small layering bilateral pleural effusions. 3. Dilatation of the main pulmonary artery to 3.2 cm, suggesting pulmonary hypertension. Right ventricular and right atrial dilatation with mild straightening of the intraventricular septum, suggestive of elevated right sided heart pressures. Findings are similar relative to recent CT. 4. Similar appearance of enlarged mediastinal, hilar, and left axillary lymph nodes as above. Again, while these findings may be related to underlying systemic disease such as sarcoidosis, possible lymphoproliferative disorder could also have this appearance. 5. Bilateral pulmonary nodules, stable relative to previous examinations, presumably benign. Electronically Signed  By: Jeannine Boga M.D.   On: 10/13/2016 22:24   Dg Chest Portable 1 View  Result Date: 10/13/2016 CLINICAL DATA:  60 year old female with history of acute on chronic shortness of breath today. Low oxygen saturations. EXAM: PORTABLE CHEST 1 VIEW COMPARISON:  Chest x-ray 09/30/2016. FINDINGS: There is cephalization of the pulmonary vasculature and slight indistinctness of the interstitial markings suggestive of mild pulmonary edema. Mild cardiomegaly trace bilateral pleural effusions. The patient is rotated to the right on today's exam, resulting in distortion of the mediastinal contours and reduced diagnostic sensitivity and specificity for mediastinal pathology. Atherosclerosis in the thoracic aorta. IMPRESSION: 1. The appearance of the chest suggests mild congestive heart failure, as above. Electronically Signed   By: Vinnie Langton M.D.   On: 10/13/2016 17:44    Procedures Procedures  (including critical care time)  Medications Ordered in ED Medications  ipratropium-albuterol (DUONEB) 0.5-2.5 (3) MG/3ML nebulizer solution 3 mL (3 mLs Nebulization Given 10/13/16 1838)  gi cocktail (Maalox,Lidocaine,Donnatal) (30 mLs Oral Given 10/13/16 1836)  lidocaine (XYLOCAINE) 2 % viscous mouth solution 15 mL (15 mLs Mouth/Throat Given 10/13/16 1836)  iopamidol (ISOVUE-370) 76 % injection (100 mLs  Contrast Given 10/13/16 2109)  furosemide (LASIX) injection 40 mg (40 mg Intravenous Given 10/14/16 0020)     Initial Impression / Assessment and Plan / ED Course  I have reviewed the triage vital signs and the nursing notes.  Pertinent labs & imaging results that were available during my care of the patient were reviewed by me and considered in my medical decision making (see chart for details).  Clinical Course   60 y.o. female presents with chronic abdominal pain and acute on chronic SOB. She initially was significantly hypoxic satting in the 70s on her normal 4 L of oxygen by nasal cannula.. She is placed on 6 L and improved up to the high 80s, waxing and waning intermittently getting up to 90%. The patient remained stable and well-appearing throughout speaking in full sentences with no respiratory distress noted.   Labs returned showing significant hypoxia on ABG with PO2 of 60, but otherwise normal blood gas.   Chest x-ray shows mild congestive heart failure. BNP mildly elevated at 328.2. She was given 40 mg of Lasix.  D-dimer was positive at 1.15, so CT PE study was performed and showed no evidence of acute PE but did show evidence of edema and pleural effusions. CT findings evidence of pulmonary hypertension, similar to previous.  On reassessment the patient was found to be persistently satting in the mid 80s despite 6 L by nasal cannula so she is placed on nonrebreather and her oxygen saturation was brought up to the low to mid 90s.   Abdominal exam benign with no tenderness to  palpation, guarding, or other peritoneal signs. Given chronicity and reassuring exam with reassuring CBC, CMP, normal lipase, do not feel that advanced imaging is necessary at this time. Feel this is chronic abdominal pain, associated with PUD. She was given a dose of GI cocktail in the ED.   Given increased oxygen demand the decision was made to admit to the hospitalist stepdown unit for further care and assessment. Feel this is likely a acute on chronic exacerbation of CHF, pulmonary hypertension. Low suspicion for infectious etiology at this time.    Final Clinical Impressions(s) / ED Diagnoses   Final diagnoses:  Hypoxia  Chronic abdominal pain  PUD (peptic ulcer disease)    New Prescriptions New Prescriptions   No medications on file  Zenovia Jarred, DO 10/14/16 [id number]    Orlie Dakin, MD 10/15/16 1332

## 2016-10-13 NOTE — ED Notes (Signed)
Flap removed from NRB mask due to improved sats of 96% per RPT instructions

## 2016-10-13 NOTE — ED Notes (Signed)
IV team at bedside 

## 2016-10-13 NOTE — ED Triage Notes (Signed)
Pt c/o chronic abdominal pain. Pt denies n/v/d. Reports some constipation. Pt on 2 lpm home O2, upon arrival to ED pt's O2 sat 60%. Pt O2 take is not continuous. Pt switched to ED O2, sats increased to 89%. Pt in no obvious respiratory distress. Pt denies CP, states she is always short of breath.

## 2016-10-13 NOTE — ED Notes (Signed)
Report given to moe rn

## 2016-10-13 NOTE — ED Provider Notes (Addendum)
Patient complains of diffuse abdominal pain for the past 9 months becoming worse over the past 2 days. She reports that she's been told by her gastroenterologist that abdominal pain is a result of ulcers.. She also states that breathing has become worse over the past one or 2 days. She denies cough. Denies chest pain. On exam she appears in no distress lungs clear auscultation heart regular rate and rhythm abdomen obese, nontender extremities without edema. Patient's arterial blood gas noted to be remarkable for hypoxemia on 6 L of oxygen. Patient normally wears 4 L of home oxygen via nasal cannula Results for orders placed or performed during the hospital encounter of 10/13/16  Lipase, blood  Result Value Ref Range   Lipase 22 11 - 51 U/L  Comprehensive metabolic panel  Result Value Ref Range   Sodium 139 135 - 145 mmol/L   Potassium 4.1 3.5 - 5.1 mmol/L   Chloride 105 101 - 111 mmol/L   CO2 24 22 - 32 mmol/L   Glucose, Bld 213 (H) 65 - 99 mg/dL   BUN 7 6 - 20 mg/dL   Creatinine, Ser 0.76 0.44 - 1.00 mg/dL   Calcium 9.1 8.9 - 10.3 mg/dL   Total Protein 6.3 (L) 6.5 - 8.1 g/dL   Albumin 3.1 (L) 3.5 - 5.0 g/dL   AST 23 15 - 41 U/L   ALT 23 14 - 54 U/L   Alkaline Phosphatase 72 38 - 126 U/L   Total Bilirubin 0.7 0.3 - 1.2 mg/dL   GFR calc non Af Amer >60 >60 mL/min   GFR calc Af Amer >60 >60 mL/min   Anion gap 10 5 - 15  CBC  Result Value Ref Range   WBC 9.9 4.0 - 10.5 K/uL   RBC 4.62 3.87 - 5.11 MIL/uL   Hemoglobin 12.1 12.0 - 15.0 g/dL   HCT 40.7 36.0 - 46.0 %   MCV 88.1 78.0 - 100.0 fL   MCH 26.2 26.0 - 34.0 pg   MCHC 29.7 (L) 30.0 - 36.0 g/dL   RDW 18.3 (H) 11.5 - 15.5 %   Platelets 254 150 - 400 K/uL  Urinalysis, Routine w reflex microscopic  Result Value Ref Range   Color, Urine YELLOW YELLOW   APPearance CLEAR CLEAR   Specific Gravity, Urine 1.010 1.005 - 1.030   pH 5.5 5.0 - 8.0   Glucose, UA NEGATIVE NEGATIVE mg/dL   Hgb urine dipstick NEGATIVE NEGATIVE   Bilirubin  Urine NEGATIVE NEGATIVE   Ketones, ur NEGATIVE NEGATIVE mg/dL   Protein, ur NEGATIVE NEGATIVE mg/dL   Nitrite NEGATIVE NEGATIVE   Leukocytes, UA NEGATIVE NEGATIVE  Brain natriuretic peptide  Result Value Ref Range   B Natriuretic Peptide 328.2 (H) 0.0 - 100.0 pg/mL  D-dimer, quantitative (not at [facility])  Result Value Ref Range   D-Dimer, Quant 1.15 (H) 0.00 - 0.50 ug/mL-FEU  I-Stat Troponin, ED (not at [facility])  Result Value Ref Range   Troponin i, poc 0.00 0.00 - 0.08 ng/mL   Comment 3          I-Stat Arterial Blood Gas, ED - (order at [facility] and [facility] only)  Result Value Ref Range   pH, Arterial 7.389 7.350 - 7.450   pCO2 arterial 45.3 32.0 - 48.0 mmHg   pO2, Arterial 60.0 (L) 83.0 - 108.0 mmHg   Bicarbonate 27.2 20.0 - 28.0 mmol/L   TCO2 29 0 - 100 mmol/L   O2 Saturation 90.0 %   Acid-Base Excess 2.0 0.0 -  2.0 mmol/L   Patient temperature 99.5 F    Collection site BRACHIAL ARTERY    Drawn by RT    Sample type ARTERIAL    Ct Angio Chest Pe W And/or Wo Contrast  Result Date: 10/13/2016 CLINICAL DATA:  Initial evaluation for increased oxygen demand, hypoxia. EXAM: CT ANGIOGRAPHY CHEST WITH CONTRAST TECHNIQUE: Multidetector CT imaging of the chest was performed using the standard protocol during bolus administration of intravenous contrast. Multiplanar CT image reconstructions and MIPs were obtained to evaluate the vascular anatomy. CONTRAST:  90 cc of Isovue 370. COMPARISON:  Prior CT from 09/20/2016 as well as previous radiograph from earlier same day. FINDINGS: Cardiovascular: Intrathoracic aorta a grossly normal and of normal caliber. No definite aortic abnormality, although evaluation limited due to timing of the contrast bolus. Cardiomegaly is stable from prior. Right ventricular and right atrial enlargement with straightening of the intraventricular septum. No pericardial effusion. Pulmonary arterial tree adequately opacified for evaluation. Main pulmonary artery mildly dilated to 3.2 cm  and transaxial diameter. No filling defect to suggest acute pulmonary embolism identified. Re-formatted imaging confirms these findings. From the data bowing for possible small distal emboli somewhat limited on CT moved respiratory motion artifact and body habitus. Mediastinum/Nodes: Visualized thyroid is normal. Scattered enlarged mediastinal lymph nodes measure up to approximately 2 cm in short access, not significantly changed relative to recent CT. Prevascular node measures 15 mm, stable. Right hilar node measures 14 mm. Soft tissue fullness at the left hilum with 16 mm left hilar node also similar. Mildly prominent left axillary node measures up to 11 mm, similar to previous. No right axillary adenopathy. Esophagus within normal limits. Lungs/Pleura: Small layering bilateral pleural effusions. Diffuse interlobular septal thickening suggestive of pulmonary edema. 5 mm right upper lobe nodule is stable (series 6, image 32). Additional 10 x 9 mm nodular density within the posterior left upper lobe also stable (series 6, image 34). Unchanged 5 mm subpleural left upper lobe nodule (series 6, image 42). No focal infiltrates. Linear atelectasis within the right middle lobe and lingula. No pneumothorax. Upper Abdomen: Visualized upper abdomen within normal limits. Musculoskeletal: No acute osseous abnormality. No worrisome lytic or blastic osseous lesions. Review of the MIP images confirms the above findings. IMPRESSION: 1. No CT evidence for acute pulmonary embolism. 2. Diffuse interlobular septal thickening, suggesting mild pulmonary interstitial edema. Small layering bilateral pleural effusions. 3. Dilatation of the main pulmonary artery to 3.2 cm, suggesting pulmonary hypertension. Right ventricular and right atrial dilatation with mild straightening of the intraventricular septum, suggestive of elevated right sided heart pressures. Findings are similar relative to recent CT. 4. Similar appearance of enlarged  mediastinal, hilar, and left axillary lymph nodes as above. Again, while these findings may be related to underlying systemic disease such as sarcoidosis, possible lymphoproliferative disorder could also have this appearance. 5. Bilateral pulmonary nodules, stable relative to previous examinations, presumably benign. Electronically Signed   By: Jeannine Boga M.D.   On: 10/13/2016 22:24   Dg Chest Portable 1 View  Result Date: 10/13/2016 CLINICAL DATA:  60 year old female with history of acute on chronic shortness of breath today. Low oxygen saturations. EXAM: PORTABLE CHEST 1 VIEW COMPARISON:  Chest x-ray 09/30/2016. FINDINGS: There is cephalization of the pulmonary vasculature and slight indistinctness of the interstitial markings suggestive of mild pulmonary edema. Mild cardiomegaly trace bilateral pleural effusions. The patient is rotated to the right on today's exam, resulting in distortion of the mediastinal contours and reduced diagnostic sensitivity and specificity for mediastinal  pathology. Atherosclerosis in the thoracic aorta. IMPRESSION: 1. The appearance of the chest suggests mild congestive heart failure, as above. Electronically Signed   By: Vinnie Langton M.D.   On: 10/13/2016 17:44     Orlie Dakin, MD 10/14/16 [place], MD 10/14/16 [phone]

## 2016-10-14 ENCOUNTER — Encounter (HOSPITAL_COMMUNITY): Payer: Self-pay | Admitting: Internal Medicine

## 2016-10-14 ENCOUNTER — Inpatient Hospital Stay (HOSPITAL_COMMUNITY): Payer: BLUE CROSS/BLUE SHIELD

## 2016-10-14 DIAGNOSIS — J449 Chronic obstructive pulmonary disease, unspecified: Secondary | ICD-10-CM | POA: Diagnosis not present

## 2016-10-14 DIAGNOSIS — E662 Morbid (severe) obesity with alveolar hypoventilation: Secondary | ICD-10-CM | POA: Diagnosis present

## 2016-10-14 DIAGNOSIS — R1013 Epigastric pain: Secondary | ICD-10-CM | POA: Diagnosis not present

## 2016-10-14 DIAGNOSIS — E785 Hyperlipidemia, unspecified: Secondary | ICD-10-CM | POA: Diagnosis present

## 2016-10-14 DIAGNOSIS — I272 Pulmonary hypertension, unspecified: Secondary | ICD-10-CM | POA: Diagnosis present

## 2016-10-14 DIAGNOSIS — J9601 Acute respiratory failure with hypoxia: Secondary | ICD-10-CM | POA: Diagnosis not present

## 2016-10-14 DIAGNOSIS — G4733 Obstructive sleep apnea (adult) (pediatric): Secondary | ICD-10-CM | POA: Diagnosis not present

## 2016-10-14 DIAGNOSIS — I5033 Acute on chronic diastolic (congestive) heart failure: Secondary | ICD-10-CM | POA: Diagnosis present

## 2016-10-14 DIAGNOSIS — G8929 Other chronic pain: Secondary | ICD-10-CM | POA: Diagnosis present

## 2016-10-14 DIAGNOSIS — Z794 Long term (current) use of insulin: Secondary | ICD-10-CM | POA: Diagnosis not present

## 2016-10-14 DIAGNOSIS — J9621 Acute and chronic respiratory failure with hypoxia: Secondary | ICD-10-CM | POA: Diagnosis present

## 2016-10-14 DIAGNOSIS — Z9981 Dependence on supplemental oxygen: Secondary | ICD-10-CM | POA: Diagnosis not present

## 2016-10-14 DIAGNOSIS — I2609 Other pulmonary embolism with acute cor pulmonale: Secondary | ICD-10-CM | POA: Diagnosis not present

## 2016-10-14 DIAGNOSIS — K259 Gastric ulcer, unspecified as acute or chronic, without hemorrhage or perforation: Secondary | ICD-10-CM | POA: Diagnosis present

## 2016-10-14 DIAGNOSIS — E1169 Type 2 diabetes mellitus with other specified complication: Secondary | ICD-10-CM | POA: Diagnosis not present

## 2016-10-14 DIAGNOSIS — E089 Diabetes mellitus due to underlying condition without complications: Secondary | ICD-10-CM | POA: Diagnosis not present

## 2016-10-14 DIAGNOSIS — I2781 Cor pulmonale (chronic): Secondary | ICD-10-CM | POA: Diagnosis present

## 2016-10-14 DIAGNOSIS — Z8249 Family history of ischemic heart disease and other diseases of the circulatory system: Secondary | ICD-10-CM | POA: Diagnosis not present

## 2016-10-14 DIAGNOSIS — R1084 Generalized abdominal pain: Secondary | ICD-10-CM | POA: Diagnosis present

## 2016-10-14 DIAGNOSIS — Z955 Presence of coronary angioplasty implant and graft: Secondary | ICD-10-CM | POA: Diagnosis not present

## 2016-10-14 DIAGNOSIS — J441 Chronic obstructive pulmonary disease with (acute) exacerbation: Secondary | ICD-10-CM | POA: Diagnosis present

## 2016-10-14 DIAGNOSIS — Z833 Family history of diabetes mellitus: Secondary | ICD-10-CM | POA: Diagnosis not present

## 2016-10-14 DIAGNOSIS — K279 Peptic ulcer, site unspecified, unspecified as acute or chronic, without hemorrhage or perforation: Secondary | ICD-10-CM | POA: Diagnosis present

## 2016-10-14 DIAGNOSIS — Z599 Problem related to housing and economic circumstances, unspecified: Secondary | ICD-10-CM | POA: Diagnosis not present

## 2016-10-14 DIAGNOSIS — R109 Unspecified abdominal pain: Secondary | ICD-10-CM | POA: Diagnosis present

## 2016-10-14 DIAGNOSIS — I5031 Acute diastolic (congestive) heart failure: Secondary | ICD-10-CM | POA: Diagnosis not present

## 2016-10-14 DIAGNOSIS — Z809 Family history of malignant neoplasm, unspecified: Secondary | ICD-10-CM | POA: Diagnosis not present

## 2016-10-14 DIAGNOSIS — I2510 Atherosclerotic heart disease of native coronary artery without angina pectoris: Secondary | ICD-10-CM | POA: Diagnosis present

## 2016-10-14 DIAGNOSIS — Z7982 Long term (current) use of aspirin: Secondary | ICD-10-CM | POA: Diagnosis not present

## 2016-10-14 DIAGNOSIS — E119 Type 2 diabetes mellitus without complications: Secondary | ICD-10-CM | POA: Diagnosis not present

## 2016-10-14 DIAGNOSIS — I2721 Secondary pulmonary arterial hypertension: Secondary | ICD-10-CM | POA: Diagnosis present

## 2016-10-14 DIAGNOSIS — I110 Hypertensive heart disease with heart failure: Secondary | ICD-10-CM | POA: Diagnosis present

## 2016-10-14 DIAGNOSIS — Z6836 Body mass index (BMI) 36.0-36.9, adult: Secondary | ICD-10-CM | POA: Diagnosis not present

## 2016-10-14 DIAGNOSIS — E1165 Type 2 diabetes mellitus with hyperglycemia: Secondary | ICD-10-CM | POA: Diagnosis present

## 2016-10-14 DIAGNOSIS — I509 Heart failure, unspecified: Secondary | ICD-10-CM | POA: Diagnosis not present

## 2016-10-14 DIAGNOSIS — R0600 Dyspnea, unspecified: Secondary | ICD-10-CM | POA: Diagnosis not present

## 2016-10-14 DIAGNOSIS — Z9119 Patient's noncompliance with other medical treatment and regimen: Secondary | ICD-10-CM | POA: Diagnosis not present

## 2016-10-14 DIAGNOSIS — F17210 Nicotine dependence, cigarettes, uncomplicated: Secondary | ICD-10-CM | POA: Diagnosis present

## 2016-10-14 DIAGNOSIS — I10 Essential (primary) hypertension: Secondary | ICD-10-CM | POA: Diagnosis not present

## 2016-10-14 DIAGNOSIS — R918 Other nonspecific abnormal finding of lung field: Secondary | ICD-10-CM | POA: Diagnosis not present

## 2016-10-14 LAB — COMPREHENSIVE METABOLIC PANEL
ALBUMIN: 2.9 g/dL — AB (ref 3.5–5.0)
ALT: 20 U/L (ref 14–54)
AST: 15 U/L (ref 15–41)
Alkaline Phosphatase: 64 U/L (ref 38–126)
Anion gap: 9 (ref 5–15)
BUN: 9 mg/dL (ref 6–20)
CHLORIDE: 104 mmol/L (ref 101–111)
CO2: 29 mmol/L (ref 22–32)
CREATININE: 0.81 mg/dL (ref 0.44–1.00)
Calcium: 9.3 mg/dL (ref 8.9–10.3)
GFR calc Af Amer: >60 mL/min (ref >60)
GFR calc non Af Amer: >60 mL/min (ref >60)
GLUCOSE: 170 mg/dL — AB (ref 65–99)
POTASSIUM: 3.8 mmol/L (ref 3.5–5.1)
Sodium: 142 mmol/L (ref 135–145)
Total Bilirubin: 0.7 mg/dL (ref 0.3–1.2)
Total Protein: 6.4 g/dL — ABNORMAL LOW (ref 6.5–8.1)

## 2016-10-14 LAB — CBC WITH DIFFERENTIAL/PLATELET
Basophils Absolute: 0 10*3/uL (ref 0.0–0.1)
Basophils Relative: 0 %
EOS ABS: 0.1 10*3/uL (ref 0.0–0.7)
EOS PCT: 1 %
HCT: 40.8 % (ref 36.0–46.0)
Hemoglobin: 11.7 g/dL — ABNORMAL LOW (ref 12.0–15.0)
LYMPHS ABS: 1.8 10*3/uL (ref 0.7–4.0)
LYMPHS PCT: 19 %
MCH: 26.1 pg (ref 26.0–34.0)
MCHC: 28.7 g/dL — AB (ref 30.0–36.0)
MCV: 91.1 fL (ref 78.0–100.0)
MONO ABS: 0.4 10*3/uL (ref 0.1–1.0)
MONOS PCT: 5 %
Neutro Abs: 7.4 10*3/uL (ref 1.7–7.7)
Neutrophils Relative %: 75 %
PLATELETS: 225 10*3/uL (ref 150–400)
RBC: 4.48 MIL/uL (ref 3.87–5.11)
RDW: 18.6 % — AB (ref 11.5–15.5)
WBC: 9.8 10*3/uL (ref 4.0–10.5)

## 2016-10-14 LAB — URINALYSIS, ROUTINE W REFLEX MICROSCOPIC
Bilirubin Urine: NEGATIVE
GLUCOSE, UA: NEGATIVE mg/dL
HGB URINE DIPSTICK: NEGATIVE
KETONES UR: NEGATIVE mg/dL
Leukocytes, UA: NEGATIVE
Nitrite: NEGATIVE
PROTEIN: NEGATIVE mg/dL
Specific Gravity, Urine: 1.01 (ref 1.005–1.030)
pH: 5.5 (ref 5.0–8.0)

## 2016-10-14 LAB — GLUCOSE, CAPILLARY
GLUCOSE-CAPILLARY: 221 mg/dL — AB (ref 65–99)
GLUCOSE-CAPILLARY: 179 mg/dL — AB (ref 65–99)
GLUCOSE-CAPILLARY: 329 mg/dL — AB (ref 65–99)
Glucose-Capillary: 258 mg/dL — ABNORMAL HIGH (ref 65–99)
Glucose-Capillary: 325 mg/dL — ABNORMAL HIGH (ref 65–99)

## 2016-10-14 LAB — TROPONIN I
TROPONIN I: 0.03 ng/mL — AB (ref <0.03)
TROPONIN I: 0.03 ng/mL — AB (ref <0.03)
Troponin I: <0.03 ng/mL (ref <0.03)

## 2016-10-14 LAB — MRSA PCR SCREENING: MRSA by PCR: NEGATIVE

## 2016-10-14 MED ORDER — LINACLOTIDE 145 MCG PO CAPS
290.0000 ug | ORAL_CAPSULE | Freq: Every day | ORAL | Status: DC
  Administered 2016-10-14 – 2016-10-22 (×9): 290 ug via ORAL
  Filled 2016-10-14 (×9): qty 2

## 2016-10-14 MED ORDER — INSULIN ASPART 100 UNIT/ML ~~LOC~~ SOLN
0.0000 [IU] | Freq: Three times a day (TID) | SUBCUTANEOUS | Status: DC
  Administered 2016-10-14: 3 [IU] via SUBCUTANEOUS
  Administered 2016-10-14: 2 [IU] via SUBCUTANEOUS
  Administered 2016-10-14: 5 [IU] via SUBCUTANEOUS
  Administered 2016-10-15 (×2): 7 [IU] via SUBCUTANEOUS

## 2016-10-14 MED ORDER — METHYLPREDNISOLONE SODIUM SUCC 40 MG IJ SOLR
40.0000 mg | Freq: Every day | INTRAMUSCULAR | Status: DC
  Administered 2016-10-14: 40 mg via INTRAVENOUS
  Filled 2016-10-14: qty 1

## 2016-10-14 MED ORDER — ASPIRIN EC 81 MG PO TBEC
81.0000 mg | DELAYED_RELEASE_TABLET | Freq: Every day | ORAL | Status: DC
  Administered 2016-10-14 – 2016-10-22 (×9): 81 mg via ORAL
  Filled 2016-10-14 (×9): qty 1

## 2016-10-14 MED ORDER — FUROSEMIDE 10 MG/ML IJ SOLN
40.0000 mg | Freq: Two times a day (BID) | INTRAMUSCULAR | Status: DC
  Administered 2016-10-14: 40 mg via INTRAVENOUS
  Filled 2016-10-14: qty 4

## 2016-10-14 MED ORDER — LACTULOSE 10 GM/15ML PO SOLN
10.0000 g | Freq: Two times a day (BID) | ORAL | Status: DC | PRN
  Administered 2016-10-14 – 2016-10-21 (×5): 10 g via ORAL
  Filled 2016-10-14 (×5): qty 15

## 2016-10-14 MED ORDER — ACETAMINOPHEN 650 MG RE SUPP: 650.0000 mg | Freq: Four times a day (QID) | RECTAL | Status: DC | PRN

## 2016-10-14 MED ORDER — SIMETHICONE 80 MG PO CHEW
80.0000 mg | CHEWABLE_TABLET | Freq: Four times a day (QID) | ORAL | Status: DC | PRN
  Administered 2016-10-16 – 2016-10-21 (×7): 80 mg via ORAL
  Filled 2016-10-14 (×7): qty 1

## 2016-10-14 MED ORDER — OXYCODONE-ACETAMINOPHEN 5-325 MG PO TABS
0.5000 | ORAL_TABLET | Freq: Three times a day (TID) | ORAL | Status: DC | PRN
  Administered 2016-10-21: 1 via ORAL
  Filled 2016-10-14 (×3): qty 1

## 2016-10-14 MED ORDER — IPRATROPIUM-ALBUTEROL 0.5-2.5 (3) MG/3ML IN SOLN
3.0000 mL | RESPIRATORY_TRACT | Status: DC | PRN
  Administered 2016-10-15: 3 mL via RESPIRATORY_TRACT
  Filled 2016-10-14: qty 3

## 2016-10-14 MED ORDER — ONDANSETRON HCL 4 MG/2ML IJ SOLN: 4.0000 mg | Freq: Four times a day (QID) | INTRAMUSCULAR | Status: DC | PRN

## 2016-10-14 MED ORDER — INSULIN ASPART 100 UNIT/ML ~~LOC~~ SOLN
20.0000 [IU] | Freq: Two times a day (BID) | SUBCUTANEOUS | Status: DC
  Administered 2016-10-14 – 2016-10-17 (×8): 20 [IU] via SUBCUTANEOUS

## 2016-10-14 MED ORDER — SODIUM CHLORIDE 0.9% FLUSH
3.0000 mL | Freq: Two times a day (BID) | INTRAVENOUS | Status: DC
  Administered 2016-10-14 – 2016-10-22 (×16): 3 mL via INTRAVENOUS

## 2016-10-14 MED ORDER — FUROSEMIDE 10 MG/ML IJ SOLN
40.0000 mg | Freq: Every day | INTRAMUSCULAR | Status: DC
  Administered 2016-10-15 – 2016-10-17 (×3): 40 mg via INTRAVENOUS
  Filled 2016-10-14 (×3): qty 4

## 2016-10-14 MED ORDER — LISINOPRIL 5 MG PO TABS
5.0000 mg | ORAL_TABLET | Freq: Every day | ORAL | Status: DC
  Administered 2016-10-15 – 2016-10-21 (×6): 5 mg via ORAL
  Filled 2016-10-14 (×9): qty 1

## 2016-10-14 MED ORDER — SIMVASTATIN 40 MG PO TABS
40.0000 mg | ORAL_TABLET | Freq: Every day | ORAL | Status: DC
  Administered 2016-10-14 – 2016-10-22 (×9): 40 mg via ORAL
  Filled 2016-10-14 (×9): qty 1

## 2016-10-14 MED ORDER — POLYETHYLENE GLYCOL 3350 17 G PO PACK
17.0000 g | PACK | Freq: Every day | ORAL | Status: DC | PRN
  Administered 2016-10-20: 17 g via ORAL
  Filled 2016-10-14: qty 1

## 2016-10-14 MED ORDER — IPRATROPIUM-ALBUTEROL 0.5-2.5 (3) MG/3ML IN SOLN
3.0000 mL | Freq: Four times a day (QID) | RESPIRATORY_TRACT | Status: DC
  Administered 2016-10-14 – 2016-10-22 (×32): 3 mL via RESPIRATORY_TRACT
  Filled 2016-10-14 (×33): qty 3

## 2016-10-14 MED ORDER — ENOXAPARIN SODIUM 40 MG/0.4ML ~~LOC~~ SOLN
40.0000 mg | SUBCUTANEOUS | Status: DC
  Administered 2016-10-14 – 2016-10-18 (×5): 40 mg via SUBCUTANEOUS
  Filled 2016-10-14 (×5): qty 0.4

## 2016-10-14 MED ORDER — INSULIN DETEMIR 100 UNIT/ML ~~LOC~~ SOLN
50.0000 [IU] | Freq: Two times a day (BID) | SUBCUTANEOUS | Status: DC
  Administered 2016-10-14 – 2016-10-17 (×9): 50 [IU] via SUBCUTANEOUS
  Filled 2016-10-14 (×11): qty 0.5

## 2016-10-14 MED ORDER — ACETAMINOPHEN 325 MG PO TABS
650.0000 mg | ORAL_TABLET | Freq: Four times a day (QID) | ORAL | Status: DC | PRN
  Administered 2016-10-14 – 2016-10-20 (×4): 650 mg via ORAL
  Filled 2016-10-14 (×3): qty 2

## 2016-10-14 MED ORDER — MOMETASONE FURO-FORMOTEROL FUM 200-5 MCG/ACT IN AERO
2.0000 | INHALATION_SPRAY | Freq: Two times a day (BID) | RESPIRATORY_TRACT | Status: DC
  Administered 2016-10-14: 2 via RESPIRATORY_TRACT
  Filled 2016-10-14: qty 8.8

## 2016-10-14 MED ORDER — CARVEDILOL 3.125 MG PO TABS
3.1250 mg | ORAL_TABLET | Freq: Two times a day (BID) | ORAL | Status: DC
  Administered 2016-10-14 – 2016-10-22 (×17): 3.125 mg via ORAL
  Filled 2016-10-14 (×18): qty 1

## 2016-10-14 MED ORDER — ONDANSETRON HCL 4 MG PO TABS: 4.0000 mg | ORAL_TABLET | Freq: Four times a day (QID) | ORAL | Status: DC | PRN

## 2016-10-14 MED ORDER — IPRATROPIUM-ALBUTEROL 0.5-2.5 (3) MG/3ML IN SOLN
3.0000 mL | RESPIRATORY_TRACT | Status: DC
  Administered 2016-10-14: 3 mL via RESPIRATORY_TRACT
  Filled 2016-10-14 (×2): qty 3

## 2016-10-14 MED ORDER — SUCRALFATE 1 G PO TABS
1.0000 g | ORAL_TABLET | Freq: Three times a day (TID) | ORAL | Status: DC
  Administered 2016-10-14 – 2016-10-22 (×27): 1 g via ORAL
  Filled 2016-10-14 (×28): qty 1

## 2016-10-14 MED ORDER — PANTOPRAZOLE SODIUM 40 MG PO TBEC
40.0000 mg | DELAYED_RELEASE_TABLET | Freq: Every day | ORAL | Status: DC
  Administered 2016-10-14 – 2016-10-22 (×9): 40 mg via ORAL
  Filled 2016-10-14 (×10): qty 1

## 2016-10-14 NOTE — Progress Notes (Signed)
Patient ID: Nancy Castillo, female   DOB: 1956-08-28, 60 y.o.   MRN: [id number] Patient admitted after midnight. For details please refer to admission note done 10/14/2016.  60 year old female with COPD, chronic hypoxic respiratory failure on 3 L nasal cannula home oxygen, pulmonary hypertension, coronary artery disease who presented to [facility] with abdominal pain and shortness of breath. Patient had workup done in [facility] in regards to abdominal pain and she had EGD which showed gastric ulcer. On admission, patient was hypoxic and required Ventimask.  Abdominal x-ray did not show acute findings. CT angiogram of the chest showed no evidence for acute pulmonary embolism. There was however diffuse septal thickening suggesting mild pulmonary interstitial edema, pulmonary hypertension. She was started on nebulizer treatments admitted to stepdown for further management of acute COPD exacerbation.   Assessment and plan:  Acute COPD exacerbation  / acute on chronic respiratory failure with hypoxia, oxygen dependent - Probably secondary to acute CHF and COPD exacerbation - She is on nebulizer treatments, DuoNeb every 4 hours scheduled as well as every 2 hours as needed for shortness of breath or wheezing - We will add Solu-Medrol 40 mg IV daily - No acute pulmonary embolism identified - Continue oxygen support or Ventimask to keep oxygen saturation above 41%  Acute diastolicCHF exacerbation - Obtained 2-D echo - BNP on the admission 300 - Patient on 40 mg IV Lasix twice daily but we will co once a day until we get 2-D echo results   Leisa Lenz [facility] [phone]

## 2016-10-14 NOTE — H&P (Signed)
History and Physical    Nancy Castillo HQI:[id number] DOB: September 20, 1956 DOA: 10/13/2016  PCP: No primary care provider on file.  Patient coming from: Home.  Chief Complaint: Abdominal pain.  HPI: Nancy Castillo is a 60 y.o. female with chronic hypoxic respiratory failure on home oxygen secondary to COPD and pulmonary hypertension, CAD, chronic abdominal pain with peptic ulcer disease presents to the ER because of abdominal pain. Patient's pain is mostly in the epigastric area which at times becomes diffuse. Pain increases on eating and patient has had extensive workup done at [facility] with EGD showing gastric ulcer. In the ER patient was found to be hypoxic requiring nonrebreather. D-dimer was elevated and CT angiogram of the chest was done which shows features concerning for congestive heart failure. Patient states she is not short of breath more than her baseline. Denies chest pain.  Patient has had normal cardiac cath in August of this year at [facility]. 2-D echo done in July of this year showed EF of 70% with dilated RV and moderate to severe pulmonary hypertension. Patient was given Lasix 40 mg in the ER and admitted for further management of acute respiratory failure most likely from acute cor pulmonale.   ED Course: CT angiogram of the chest showed congestion. Lasix 40 mg IV was given. Abdomen was benign on exam.  Review of Systems: As per HPI, rest all negative.   Past Medical History:  Diagnosis Date  . Asthma   . COPD (chronic obstructive pulmonary disease) ([place])   . Coronary artery disease   . Diabetes mellitus   . Hypertension   . Sleep apnea     Past Surgical History:  Procedure Laterality Date  . CORONARY ANGIOPLASTY    . LUNG LOBECTOMY       reports that she has been smoking.  She has been smoking about 0.50 packs per day. She has never used smokeless tobacco. She reports that she does not drink alcohol or use drugs.  No Known Allergies  Family History    Problem Relation Age of Onset  . Hypertension Mother     Prior to Admission medications   Medication Sig Start Date End Date Taking? Authorizing Provider  albuterol (PROVENTIL HFA;VENTOLIN HFA) 108 (90 Base) MCG/ACT inhaler Inhale 2 puffs into the lungs every 4 (four) hours as needed for wheezing or shortness of breath.  08/04/16  Yes Historical Provider, MD  aspirin 81 MG EC tablet Take 81 mg by mouth daily.     Yes Historical Provider, MD  carvedilol (COREG) 3.125 MG tablet Take 3.125 mg by mouth 2 (two) times daily with a meal.   Yes Historical Provider, MD  Insulin Detemir ([name] FLEXPEN) 100 UNIT/ML Pen Inject 50 Units into the skin 2 (two) times daily.   Yes Historical Provider, MD  insulin lispro (HUMALOG KWIKPEN) 100 UNIT/ML KiwkPen Inject 20 Units into the skin 2 (two) times daily.   Yes Historical Provider, MD  lactulose, encephalopathy, (GENERLAC) 10 GM/15ML SOLN Take 10 g by mouth 2 (two) times daily as needed (for constipation).   Yes Historical Provider, MD  linaclotide (LINZESS) 290 MCG CAPS capsule Take 290 mcg by mouth daily before breakfast.   Yes Historical Provider, MD  lisinopril (PRINIVIL,ZESTRIL) 5 MG tablet Take 5 mg by mouth daily.   Yes Historical Provider, MD  omeprazole (PRILOSEC) 40 MG capsule Take 40 mg by mouth daily.   Yes Historical Provider, MD  oxyCODONE-acetaminophen (PERCOCET/ROXICET) 5-325 MG tablet Take 0.5-1 tablets by mouth every  8 (eight) hours as needed (for pain).    Yes Historical Provider, MD  pantoprazole (PROTONIX) 40 MG tablet Take 40 mg by mouth daily. 10/04/16 11/03/16 Yes Historical Provider, MD  polyethylene glycol (MIRALAX / GLYCOLAX) packet Take 17 g by mouth daily as needed for constipation. 10/04/16 11/03/16 Yes Historical Provider, MD  simethicone (MYLICON) 80 MG chewable tablet Chew 80 mg by mouth every 6 (six) hours as needed for flatulence. 10/04/16 11/03/16 Yes Historical Provider, MD  simvastatin (ZOCOR) 40 MG tablet Take 40 mg by  mouth daily.   Yes Historical Provider, MD  sitaGLIPtin-metformin ([name]) 50-1000 MG tablet Take 1 tablet by mouth every morning.   Yes Historical Provider, MD  sucralfate (CARAFATE) 1 g tablet Take 1 g by mouth 3 (three) times daily.    Yes Historical Provider, MD  Vitamin D, Ergocalciferol, (DRISDOL) 50000 units CAPS capsule Take 50,000 Units by mouth every 7 (seven) days.   Yes Historical Provider, MD  Fluticasone-Salmeterol (ADVAIR DISKUS) 250-50 MCG/DOSE AEPB Inhale 1 puff into the lungs 2 (two) times daily.    Historical Provider, MD  furosemide (LASIX) 20 MG tablet Take 20 mg by mouth daily as needed for fluid or edema.    Historical Provider, MD  umeclidinium bromide (INCRUSE ELLIPTA) 62.5 MCG/INH AEPB Inhale 1 puff into the lungs daily. 10/04/16 11/03/16  Historical Provider, MD    Physical Exam: Vitals:   10/14/16 0019 10/14/16 0030 10/14/16 0100 10/14/16 0155  BP: 133/92 119/74 129/90 107/68  Pulse: 101 103 113 (!) 105  Resp: '16 26 26 '$ (!) 27  Temp:    98.4 F (36.9 C)  TempSrc:    Oral  SpO2: 95% 93% 95% 98%  Weight:    108.7 kg (239 lb 10.2 oz)  Height:    '5\' 7"'$  (1.702 m)      Constitutional: Moderately built and nourished. Vitals:   10/14/16 0019 10/14/16 0030 10/14/16 0100 10/14/16 0155  BP: 133/92 119/74 129/90 107/68  Pulse: 101 103 113 (!) 105  Resp: '16 26 26 '$ (!) 27  Temp:    98.4 F (36.9 C)  TempSrc:    Oral  SpO2: 95% 93% 95% 98%  Weight:    108.7 kg (239 lb 10.2 oz)  Height:    '5\' 7"'$  (1.702 m)   Eyes: Anicteric no pallor. ENMT: No discharge from the ears eyes nose or mouth. Neck: JVD is elevated. No mass felt. Respiratory: Bilateral mild expiratory wheeze no crepitations. Cardiovascular: S1 and S2 heard no murmurs appreciated. Abdomen: Soft nontender bowel sounds present. No guarding or rigidity. Musculoskeletal: No edema. No joint effusion. Skin: No rash. Neurologic: Alert awake oriented to time place and person. Moves all  extremities. Psychiatric: Appears normal. Normal affect.   Labs on Admission: I have personally reviewed following labs and imaging studies  CBC:  Recent Labs Lab 10/13/16 1749  WBC 9.9  HGB 12.1  HCT 40.7  MCV 88.1  PLT 510   Basic Metabolic Panel:  Recent Labs Lab 10/13/16 1749  NA 139  K 4.1  CL 105  CO2 24  GLUCOSE 213*  BUN 7  CREATININE 0.76  CALCIUM 9.1   GFR: Estimated Creatinine Clearance: 98.5 mL/min (by C-G formula based on SCr of 0.76 mg/dL). Liver Function Tests:  Recent Labs Lab 10/13/16 1749  AST 23  ALT 23  ALKPHOS 72  BILITOT 0.7  PROT 6.3*  ALBUMIN 3.1*    Recent Labs Lab 10/13/16 1749  LIPASE 22   No results for input(s):  AMMONIA in the last 168 hours. Coagulation Profile: No results for input(s): INR, PROTIME in the last 168 hours. Cardiac Enzymes: No results for input(s): CKTOTAL, CKMB, CKMBINDEX, TROPONINI in the last 168 hours. BNP (last 3 results) No results for input(s): PROBNP in the last 8760 hours. HbA1C: No results for input(s): HGBA1C in the last 72 hours. CBG: No results for input(s): GLUCAP in the last 168 hours. Lipid Profile: No results for input(s): CHOL, HDL, LDLCALC, TRIG, CHOLHDL, LDLDIRECT in the last 72 hours. Thyroid Function Tests: No results for input(s): TSH, T4TOTAL, FREET4, T3FREE, THYROIDAB in the last 72 hours. Anemia Panel: No results for input(s): VITAMINB12, FOLATE, FERRITIN, TIBC, IRON, RETICCTPCT in the last 72 hours. Urine analysis:    Component Value Date/Time   COLORURINE YELLOW 10/13/2016 0002   APPEARANCEUR CLEAR 10/13/2016 0002   LABSPEC 1.010 10/13/2016 0002   PHURINE 5.5 10/13/2016 0002   GLUCOSEU NEGATIVE 10/13/2016 0002   HGBUR NEGATIVE 10/13/2016 0002   BILIRUBINUR NEGATIVE 10/13/2016 0002   KETONESUR NEGATIVE 10/13/2016 0002   PROTEINUR NEGATIVE 10/13/2016 0002   NITRITE NEGATIVE 10/13/2016 0002   LEUKOCYTESUR NEGATIVE 10/13/2016 0002   Sepsis  Labs: '@LABRCNTIP'$ (procalcitonin:4,lacticidven:4) )No results found for this or any previous visit (from the past 240 hour(s)).   Radiological Exams on Admission: Dg Abd 1 View  Result Date: 10/14/2016 CLINICAL DATA:  Bilateral abdominal pain EXAM: ABDOMEN - 1 VIEW COMPARISON:  10/03/2016, 10/02/2016 FINDINGS: Nonspecific nonobstructed gas pattern. No pathologic calcifications. IMPRESSION: Nonspecific nonobstructed gas pattern Electronically Signed   By: Donavan Foil M.D.   On: 10/14/2016 01:57   Ct Angio Chest Pe W And/or Wo Contrast  Result Date: 10/13/2016 CLINICAL DATA:  Initial evaluation for increased oxygen demand, hypoxia. EXAM: CT ANGIOGRAPHY CHEST WITH CONTRAST TECHNIQUE: Multidetector CT imaging of the chest was performed using the standard protocol during bolus administration of intravenous contrast. Multiplanar CT image reconstructions and MIPs were obtained to evaluate the vascular anatomy. CONTRAST:  90 cc of Isovue 370. COMPARISON:  Prior CT from 09/20/2016 as well as previous radiograph from earlier same day. FINDINGS: Cardiovascular: Intrathoracic aorta a grossly normal and of normal caliber. No definite aortic abnormality, although evaluation limited due to timing of the contrast bolus. Cardiomegaly is stable from prior. Right ventricular and right atrial enlargement with straightening of the intraventricular septum. No pericardial effusion. Pulmonary arterial tree adequately opacified for evaluation. Main pulmonary artery mildly dilated to 3.2 cm and transaxial diameter. No filling defect to suggest acute pulmonary embolism identified. Re-formatted imaging confirms these findings. From the data bowing for possible small distal emboli somewhat limited on CT moved respiratory motion artifact and body habitus. Mediastinum/Nodes: Visualized thyroid is normal. Scattered enlarged mediastinal lymph nodes measure up to approximately 2 cm in short access, not significantly changed relative to  recent CT. Prevascular node measures 15 mm, stable. Right hilar node measures 14 mm. Soft tissue fullness at the left hilum with 16 mm left hilar node also similar. Mildly prominent left axillary node measures up to 11 mm, similar to previous. No right axillary adenopathy. Esophagus within normal limits. Lungs/Pleura: Small layering bilateral pleural effusions. Diffuse interlobular septal thickening suggestive of pulmonary edema. 5 mm right upper lobe nodule is stable (series 6, image 32). Additional 10 x 9 mm nodular density within the posterior left upper lobe also stable (series 6, image 34). Unchanged 5 mm subpleural left upper lobe nodule (series 6, image 42). No focal infiltrates. Linear atelectasis within the right middle lobe and lingula. No pneumothorax. Upper Abdomen:  Visualized upper abdomen within normal limits. Musculoskeletal: No acute osseous abnormality. No worrisome lytic or blastic osseous lesions. Review of the MIP images confirms the above findings. IMPRESSION: 1. No CT evidence for acute pulmonary embolism. 2. Diffuse interlobular septal thickening, suggesting mild pulmonary interstitial edema. Small layering bilateral pleural effusions. 3. Dilatation of the main pulmonary artery to 3.2 cm, suggesting pulmonary hypertension. Right ventricular and right atrial dilatation with mild straightening of the intraventricular septum, suggestive of elevated right sided heart pressures. Findings are similar relative to recent CT. 4. Similar appearance of enlarged mediastinal, hilar, and left axillary lymph nodes as above. Again, while these findings may be related to underlying systemic disease such as sarcoidosis, possible lymphoproliferative disorder could also have this appearance. 5. Bilateral pulmonary nodules, stable relative to previous examinations, presumably benign. Electronically Signed   By: Jeannine Boga M.D.   On: 10/13/2016 22:24   Dg Chest Portable 1 View  Result Date:  10/13/2016 CLINICAL DATA:  60 year old female with history of acute on chronic shortness of breath today. Low oxygen saturations. EXAM: PORTABLE CHEST 1 VIEW COMPARISON:  Chest x-ray 09/30/2016. FINDINGS: There is cephalization of the pulmonary vasculature and slight indistinctness of the interstitial markings suggestive of mild pulmonary edema. Mild cardiomegaly trace bilateral pleural effusions. The patient is rotated to the right on today's exam, resulting in distortion of the mediastinal contours and reduced diagnostic sensitivity and specificity for mediastinal pathology. Atherosclerosis in the thoracic aorta. IMPRESSION: 1. The appearance of the chest suggests mild congestive heart failure, as above. Electronically Signed   By: Vinnie Langton M.D.   On: 10/13/2016 17:44    EKG: Independently reviewed. Sinus tachycardia.  Assessment/Plan Principal Problem:   Acute respiratory failure with hypoxia (HCC) Active Problems:   PUD (peptic ulcer disease)   Abdominal pain   Diabetes mellitus type 2, controlled ([place])   CAD (coronary artery disease)    1. Acute respiratory failure with hypoxia - most likely from acute cor pulmonale secondary to severe COPD. Patient was given Lasix 40 mg IV in the ER and I have placed patient on Lasix 40 mg IV every 12. Closely follow respiratory status, intake output, metabolic panel and daily weights. 2-D echo done at [facility] (results of which are available in care everywhere) shows EF of 70% with dilated RV and moderate to severe pulmonary hypertension. Continue nebulizer for COPD. And I have placed patient on CPAP. 2. Abdominal pain most likely secondary to peptic ulcer disease - pending KUB. Continue PPI Carafate. If pain does not improve may consider further tests. 3. CAD status post stenting - has had a normal cardiac cath in August of this year. Continue aspirin and statins and Coreg. 4. Hypertension - continue lisinopril and Coreg. 5. OSA on  CPAP. 6. COPD - see #1. 7. Diabetes mellitus type 2 - continue [name].  I have extensively reviewed patient's old charts.   DVT prophylaxis: Lovenox. Code Status: Full code.  Family Communication: Discussed with patient.  Disposition Plan: Home.  Consults called: None.  Admission status: Inpatient.    Rise Patience MD [facility] Pager [phone].  If 7PM-7AM, please contact night-coverage [email or web address] Password [email or web address]  10/14/2016, 2:08 AM

## 2016-10-14 NOTE — Progress Notes (Addendum)
Physician notified: Charlies Silvers At: 6060  Regarding: SpO2 79-82% on 5LPM [place] humidified. Pt does not want to wear any masks, no c/o SOA pt pt statement, RR 44. States she thinks this is just her normal.  Awaiting return response.   Returned Response at: 1022  Order(s): Reinforce pt needs mask to keep SpO2 at 88%. Goal SpO2 88%.   Notified of positive troponin at 0.03.  Orders: 12 lead EKG, and repeat troponin in 6 hours x1.

## 2016-10-14 NOTE — Care Management Note (Signed)
Case Management Note  Patient Details  Name: Juline Sanderford MRN: [id number] Date of Birth: 02-May-1956  Subjective/Objective:    Presents with acute resp failure, hypoxia, abd pain, worsening x 2 days, sats 60's and 70's.  On iv lasix, NRB at 15, she has home oxygen with [facility] , she has her portable tank in the room with her, she is on 4 liters at home.  She also has a bipap at home.  She states her pcp is at [facility]. She has medication coverage and transportation at dc.  NCM will cont to follow for dc needs.                 Action/Plan:   Expected Discharge Date:                  Expected Discharge Plan:  Home/Self Care  In-House Referral:     Discharge planning Services  CM Consult  Post Acute Care Choice:    Choice offered to:     DME Arranged:    DME Agency:     HH Arranged:    HH Agency:     Status of Service:  In process, will continue to follow  If discussed at Long Length of Stay Meetings, dates discussed:    Additional Comments:  Zenon Mayo, RN 10/14/2016, 4:55 PM

## 2016-10-15 ENCOUNTER — Inpatient Hospital Stay (HOSPITAL_COMMUNITY): Payer: BLUE CROSS/BLUE SHIELD

## 2016-10-15 DIAGNOSIS — I5031 Acute diastolic (congestive) heart failure: Secondary | ICD-10-CM | POA: Diagnosis present

## 2016-10-15 DIAGNOSIS — E119 Type 2 diabetes mellitus without complications: Secondary | ICD-10-CM

## 2016-10-15 DIAGNOSIS — R0600 Dyspnea, unspecified: Secondary | ICD-10-CM

## 2016-10-15 DIAGNOSIS — R1013 Epigastric pain: Secondary | ICD-10-CM

## 2016-10-15 DIAGNOSIS — J9621 Acute and chronic respiratory failure with hypoxia: Secondary | ICD-10-CM | POA: Diagnosis present

## 2016-10-15 DIAGNOSIS — E785 Hyperlipidemia, unspecified: Secondary | ICD-10-CM

## 2016-10-15 DIAGNOSIS — I2609 Other pulmonary embolism with acute cor pulmonale: Secondary | ICD-10-CM

## 2016-10-15 DIAGNOSIS — Z794 Long term (current) use of insulin: Secondary | ICD-10-CM

## 2016-10-15 DIAGNOSIS — I10 Essential (primary) hypertension: Secondary | ICD-10-CM | POA: Diagnosis present

## 2016-10-15 DIAGNOSIS — E1169 Type 2 diabetes mellitus with other specified complication: Secondary | ICD-10-CM | POA: Diagnosis present

## 2016-10-15 DIAGNOSIS — J441 Chronic obstructive pulmonary disease with (acute) exacerbation: Secondary | ICD-10-CM | POA: Diagnosis present

## 2016-10-15 DIAGNOSIS — IMO0001 Reserved for inherently not codable concepts without codable children: Secondary | ICD-10-CM

## 2016-10-15 LAB — ECHOCARDIOGRAM COMPLETE
HEIGHTINCHES: 67 in
WEIGHTICAEL: 3812.8 [oz_av]

## 2016-10-15 LAB — GLUCOSE, CAPILLARY
GLUCOSE-CAPILLARY: 314 mg/dL — AB (ref 65–99)
GLUCOSE-CAPILLARY: 336 mg/dL — AB (ref 65–99)
GLUCOSE-CAPILLARY: 385 mg/dL — AB (ref 65–99)
Glucose-Capillary: 420 mg/dL — ABNORMAL HIGH (ref 65–99)

## 2016-10-15 MED ORDER — INSULIN ASPART 100 UNIT/ML ~~LOC~~ SOLN
0.0000 [IU] | Freq: Three times a day (TID) | SUBCUTANEOUS | Status: DC
  Administered 2016-10-15: 15 [IU] via SUBCUTANEOUS
  Administered 2016-10-16: 8 [IU] via SUBCUTANEOUS
  Administered 2016-10-16: 11 [IU] via SUBCUTANEOUS
  Administered 2016-10-16: 8 [IU] via SUBCUTANEOUS
  Administered 2016-10-17: 15 [IU] via SUBCUTANEOUS
  Administered 2016-10-17 (×2): 5 [IU] via SUBCUTANEOUS
  Administered 2016-10-18: 15 [IU] via SUBCUTANEOUS
  Administered 2016-10-18 – 2016-10-19 (×2): 11 [IU] via SUBCUTANEOUS
  Administered 2016-10-19: 15 [IU] via SUBCUTANEOUS
  Administered 2016-10-20: 3 [IU] via SUBCUTANEOUS
  Administered 2016-10-20: 5 [IU] via SUBCUTANEOUS
  Administered 2016-10-20: 11 [IU] via SUBCUTANEOUS
  Administered 2016-10-21: 15 [IU] via SUBCUTANEOUS
  Administered 2016-10-21: 11 [IU] via SUBCUTANEOUS

## 2016-10-15 MED ORDER — GI COCKTAIL ~~LOC~~
30.0000 mL | Freq: Once | ORAL | Status: AC
  Administered 2016-10-15: 30 mL via ORAL
  Filled 2016-10-15: qty 30

## 2016-10-15 MED ORDER — PREDNISONE 50 MG PO TABS
50.0000 mg | ORAL_TABLET | Freq: Every day | ORAL | Status: DC
  Administered 2016-10-15 – 2016-10-17 (×3): 50 mg via ORAL
  Filled 2016-10-15: qty 1
  Filled 2016-10-15: qty 2
  Filled 2016-10-15 (×2): qty 1

## 2016-10-15 NOTE — Progress Notes (Signed)
Patient ID: Nancy Castillo, female   DOB: 18-Apr-1956, 60 y.o.   MRN: [id number]  PROGRESS NOTE    Chistine Dematteo  PJA:[id number] DOB: Jun 11, 1956 DOA: 10/13/2016  PCP: No primary care provider on file.   Brief Narrative:   60 year old female with COPD, chronic hypoxic respiratory failure on 3 L nasal cannula home oxygen, pulmonary hypertension, coronary artery disease who presented to [facility] with abdominal pain and shortness of breath. Patient had workup done in [facility] in regards to abdominal pain and she had EGD which showed gastric ulcer. On admission, patient was hypoxic and required Ventimask.  Abdominal x-ray did not show acute findings. CT angiogram of the chest showed no evidence for acute pulmonary embolism. There was however diffuse septal thickening suggesting mild pulmonary interstitial edema, pulmonary hypertension. She was started on nebulizer treatments admitted to stepdown for further management of acute COPD exacerbation.    Assessment & Plan:   Principal Problem: Acute on chronic respiratory failure with hypoxia (HCC) / Acute exacerbation of chronic obstructive pulmonary disease (COPD) (HCC) - Hypoxia likely secondary to acute COPD exacerbation and possibly acute diastolic CHF - She is currently on 5 L nasal cannula oxygen support and oxygen saturation is 93% - Continue current nebulizer treatments, DuoNeb every 6 hours scheduled as well as every 2 hours as needed for shortness of breath or wheezing - She was on Solu-Medrol 40 mg IV daily, we will start prednisone 50 mg daily from tomorrow (order already placed and dated for 10/27) - No acute pulmonary embolism identified on CT angiogram of the chest   Active Problems: Acute diastolic CHF (congestive heart failure) (HCC) - BNP on the admission in 300 range  - No previous 2-D echo for comparison - 2-D echo on this admission is pending - Weight on the admission is 108.7 kg, order placed for daily weight and  strict intake and output - Cardiology consulted, appreciate their input  Troponin elevation - Troponin mildly elevated at 0.03, 0.03 - No reports of chest pain - The 12-lead EKG showed sinus rhythm - Appreciate cardiology input - Continue aspirin daily   PUD (peptic ulcer disease) / mid abdominal pain - Continue PPI therapy  - GI cocktail has provided symptomatic relief in ED so another order placed today - No acute findings on abdominal x-ray  Dyslipidemia associated with type 2 diabetes mellitus ([place]) - Continue simvastatin 40 mg daily  Uncontrolled diabetes mellitus without complication, with long-term current use of insulin (HCC) - Continue [name] 50 units twice daily, NovoLog 20 units twice daily and sliding scale insulin  Benign essential HTN - Continue carvedilol 3.125 mg twice daily and lisinopril 5 mg daily   DVT prophylaxis: Lovenox suBQ   Code Status: full code  Family Communication: No family at the bedside Disposition Plan: Transfer to telemetry floor today   Consultants:   Cardiology   Procedures:   2 D ECHO   Antimicrobials:   None     Subjective: Says she is still short of breath especially with exertion.  Objective: Vitals:   10/15/16 0055 10/15/16 0402 10/15/16 0744 10/15/16 0759  BP: 115/65 107/61  110/63  Pulse: (!) 101 93  94  Resp: 18 (!) 23  18  Temp: 97.8 F (36.6 C) 98.4 F (36.9 C)  97.5 F (36.4 C)  TempSrc: Oral Oral  Axillary  SpO2: 92% (!) 89% 95% 93%  Weight:      Height:        Intake/Output Summary (Last 24  hours) at 10/15/16 1023 Last data filed at 10/15/16 0410  Gross per 24 hour  Intake              120 ml  Output              200 ml  Net              -80 ml   Filed Weights   10/14/16 0155  Weight: 108.7 kg (239 lb 10.2 oz)    Examination:  General exam: Appears calm and comfortable  Respiratory system: wheezing in upper lung lobes, no rhonchi  Cardiovascular system: S1 & S2 heard, Rate controlled    Gastrointestinal system: Abdomen is nondistended, soft and nontender. No organomegaly or masses felt. Normal bowel sounds heard. Central nervous system: Alert and oriented. No focal neurological deficits. Extremities: Symmetric 5 x 5 power. Skin: No rashes, lesions or ulcers Psychiatry: Judgement and insight appear normal. Mood & affect appropriate.   Data Reviewed: I have personally reviewed following labs and imaging studies  CBC:  Recent Labs Lab 10/13/16 1749 10/14/16 1249  WBC 9.9 9.8  NEUTROABS  --  7.4  HGB 12.1 11.7*  HCT 40.7 40.8  MCV 88.1 91.1  PLT 254 643   Basic Metabolic Panel:  Recent Labs Lab 10/13/16 1749 10/14/16 1249  NA 139 142  K 4.1 3.8  CL 105 104  CO2 24 29  GLUCOSE 213* 170*  BUN 7 9  CREATININE 0.76 0.81  CALCIUM 9.1 9.3   GFR: Estimated Creatinine Clearance: 97.3 mL/min (by C-G formula based on SCr of 0.81 mg/dL). Liver Function Tests:  Recent Labs Lab 10/13/16 1749 10/14/16 1249  AST 23 15  ALT 23 20  ALKPHOS 72 64  BILITOT 0.7 0.7  PROT 6.3* 6.4*  ALBUMIN 3.1* 2.9*    Recent Labs Lab 10/13/16 1749  LIPASE 22   No results for input(s): AMMONIA in the last 168 hours. Coagulation Profile: No results for input(s): INR, PROTIME in the last 168 hours. Cardiac Enzymes:  Recent Labs Lab 10/14/16 0250 10/14/16 1249 10/14/16 1832  TROPONINI <0.03 0.03*  <0.03 0.03*   BNP (last 3 results) No results for input(s): PROBNP in the last 8760 hours. HbA1C: No results for input(s): HGBA1C in the last 72 hours. CBG:  Recent Labs Lab 10/14/16 0750 10/14/16 1236 10/14/16 1624 10/14/16 2126 10/15/16 0757  GLUCAP 221* 179* 329* 325* 314*   Lipid Profile: No results for input(s): CHOL, HDL, LDLCALC, TRIG, CHOLHDL, LDLDIRECT in the last 72 hours. Thyroid Function Tests: No results for input(s): TSH, T4TOTAL, FREET4, T3FREE, THYROIDAB in the last 72 hours. Anemia Panel: No results for input(s): VITAMINB12, FOLATE,  FERRITIN, TIBC, IRON, RETICCTPCT in the last 72 hours. Urine analysis:    Component Value Date/Time   COLORURINE YELLOW 10/13/2016 0002   APPEARANCEUR CLEAR 10/13/2016 0002   LABSPEC 1.010 10/13/2016 0002   PHURINE 5.5 10/13/2016 0002   GLUCOSEU NEGATIVE 10/13/2016 0002   HGBUR NEGATIVE 10/13/2016 0002   BILIRUBINUR NEGATIVE 10/13/2016 0002   KETONESUR NEGATIVE 10/13/2016 0002   PROTEINUR NEGATIVE 10/13/2016 0002   NITRITE NEGATIVE 10/13/2016 0002   LEUKOCYTESUR NEGATIVE 10/13/2016 0002   Sepsis Labs: '@LABRCNTIP'$ (procalcitonin:4,lacticidven:4)    Recent Results (from the past 240 hour(s))  MRSA PCR Screening     Status: None   Collection Time: 10/14/16  1:55 AM  Result Value Ref Range Status   MRSA by PCR NEGATIVE NEGATIVE Final      Radiology Studies: Dg Abd 1 View  Result Date: 10/14/2016 Nonspecific nonobstructed gas pattern Electronically Signed   By: Donavan Foil M.D.   On: 10/14/2016 01:57   Ct Angio Chest Pe W And/or Wo Contrast Result Date: 10/13/2016  1. No CT evidence for acute pulmonary embolism. 2. Diffuse interlobular septal thickening, suggesting mild pulmonary interstitial edema. Small layering bilateral pleural effusions. 3. Dilatation of the main pulmonary artery to 3.2 cm, suggesting pulmonary hypertension. Right ventricular and right atrial dilatation with mild straightening of the intraventricular septum, suggestive of elevated right sided heart pressures. Findings are similar relative to recent CT. 4. Similar appearance of enlarged mediastinal, hilar, and left axillary lymph nodes as above. Again, while these findings may be related to underlying systemic disease such as sarcoidosis, possible lymphoproliferative disorder could also have this appearance. 5. Bilateral pulmonary nodules, stable relative to previous examinations, presumably benign. Electronically Signed   By: Jeannine Boga M.D.   On: 10/13/2016 22:24   Dg Chest Portable 1 View Result  Date: 10/13/2016 1. The appearance of the chest suggests mild congestive heart failure, as above. Electronically Signed   By: Vinnie Langton M.D.   On: 10/13/2016 17:44      Scheduled Meds: . aspirin EC  81 mg Oral Daily  . carvedilol  3.125 mg Oral BID WC  . enoxaparin (LOVENOX) injection  40 mg Subcutaneous Q24H  . furosemide  40 mg Intravenous Daily  . gi cocktail  30 mL Oral Once  . insulin aspart  0-9 Units Subcutaneous TID WC  . insulin aspart  20 Units Subcutaneous BID WC  . insulin detemir  50 Units Subcutaneous BID  . ipratropium-albuterol  3 mL Nebulization QID  . linaclotide  290 mcg Oral QAC breakfast  . lisinopril  5 mg Oral Daily  . pantoprazole  40 mg Oral Daily  . [START ON 10/16/2016] predniSONE  50 mg Oral Q breakfast  . simvastatin  40 mg Oral Daily  . sodium chloride flush  3 mL Intravenous Q12H  . sucralfate  1 g Oral TID   Continuous Infusions:    LOS: 1 day    Time spent: 25 minutes  Greater than 50% of the time spent on counseling and coordinating the care.   Leisa Lenz, MD [facility] Pager [phone]  If 7PM-7AM, please contact night-coverage [email or web address] Password [email or web address] 10/15/2016, 10:23 AM

## 2016-10-15 NOTE — Progress Notes (Signed)
Inpatient Diabetes Program Recommendations  AACE/ADA: New Consensus Statement on Inpatient Glycemic Control (2015)  Target Ranges:  Prepandial:   less than 140 mg/dL      Peak postprandial:   less than 180 mg/dL (1-2 hours)      Critically ill patients:  140 - 180 mg/dL   Lab Results  Component Value Date   GLUCAP 314 (H) 10/15/2016    Review of Glycemic Control Results for Nancy Castillo, Nancy Castillo (MRN [id number]) as of 10/15/2016 10:17  Ref. Range 10/14/2016 07:50 10/14/2016 12:36 10/14/2016 16:24 10/14/2016 21:26 10/15/2016 07:57  Glucose-Capillary Latest Ref Range: 65 - 99 mg/dL 221 (H) 179 (H) 329 (H) 325 (H) 314 (H)   Diabetes history: DM2 Outpatient Diabetes medications: [name] 50 BID + Humalog 20 BID + [name] 50/1000 QD Current orders for Inpatient glycemic control: [name] 50 bid+ Novolog 20 bid ac meals+ Novolog correction 0-9 tid with meals  Inpatient Diabetes Program Recommendations:  Noted elevated CBGs. Please consider: -A1c to determine prehospital glycemic control -Add Novolog 20 units to ac lunch -While on steroids, increase Novolog correction to 0-15 tid+0-5hs  Thank you, Nani Gasser. Gabriele Zwilling, RN, MSN, CDE Inpatient Glycemic Control Team Team Pager [phone] (8am-5pm) 10/15/2016 10:27 AM

## 2016-10-15 NOTE — Consult Note (Signed)
CARDIOLOGY CONSULT NOTE   Patient ID: Nancy Castillo MRN: [id number] DOB/AGE: 05-13-1956 60 y.o.  Admit date: 10/13/2016   The patient has been seen in conjunction with Vin Bhagat, PAC. All aspects of care have been considered and discussed. The patient has been personally interviewed, examined, and all clinical data has been reviewed.   There does not appear to be in acute cardiac issue. Her cardiac problems include coronary artery disease with prior right coronary stent (recently proved widely patent by angiography and [place]), chronic diastolic left ventricular heart failure and acute on chronic right ventricular heart failure secondary to COPD. LVEF is normal and RV is mildly dilated and severely hypocontractile (echo this admission).  Trivially, elevated troponin is secondary to demand. No workup is required.  Recommend furosemide 40 mg daily. Must be careful not to over diurese in the setting of pulmonary hypertension.  Consider pulmonary consultation to consider whether pulmonary therapy and treatment of cor pulmonale is optimized. They will help [place] determine if right heart catheterization would be helpful. That would depend on whether there are available therapies for pulmonary hypertension in her disease subset.  Continuous oxygen therapy is a must.  Overall prognosis seems limited  No further cardiac workup necessary unless pulmonary feels right heart catheterization would be helpful.     Primary Physician   No primary care provider on file. Primary Cardiologist   Dr. Wyline Copas [facility])  Reason for Consultation   CHF Requesting Physician  Dr. Charlies Silvers  HPI: Nancy Castillo is a 60 y.o. female with a history of CAD s/p stent to RCA, chronic respiratory failure and moderate to severe pulmonary hypertension - on oxygen supplement, COPD,  lung nodules, diabetes, hypertension, sleep apnea ? On CPAP and former smoker who presented for evaluation of acute on chronic abdominal  pain.  Recent admission for COPD exacerbation 07/2016 at [facility]. Troponin peaked at 1.73. Follow-up cath showed patent stent and no other obstructive disease. Felt it was demand ischemia.  She was admitted 09/30/16 to 10/04/16 on internal medicine service at [facility] for acute on chronic respiratory failure with hypoxia due to COPD exacerbation. In terms improved with nebulizer and steroids.  She was doing well on cardiac stand point when last seen by primary cardiologist 09/22/2016. Per note "Patient seen Dr. Ferdinand Lango GI at [facility] where she had an endoscopy done which showed chronic gastritis and peptic ulcers. Patient was on omeprazole. This was changed to Protonix and sucralfate was also continued. Patient symptomatically felt much better".   Patient states that she again started to having abdominal pain that has being gotten worse leading to ER presentation at [facility] 10/14/16. She was found to be hypoxic at oxygen saturation of 70s on 4 L nasal cannula. Chest x-ray shows mild congestive heart failure. BNP mildly elevated at 328.2. She was given 40 mg of Lasix. D-dimer was elevated follow-up CT of chest showed no evidence of PE however date showed edema and pulmonary effusion. Pending consistent with pulmonary hypertension. The patient was admitted on internal medicine service for management of COPD exacerbation. Cardiology is asked for further evaluation. She was diuresed total 80 mL so far.  EKG shows sinus rhythm with T-wave inversion in anterior leads this is  new when compared to EKG of 02/2016. Troponin 0.03 x 2. Pending echocardiogram this admission. Her breathing has been improved.  Echocardiogram 06/24/2016 showed ventricular function of 70%, mild LVH, dilated right ventricle, moderate to severe pulmonary hypertension, trace  TR.   Past Medical History:  Diagnosis Date  . Asthma   . COPD (chronic obstructive pulmonary disease) ([place])   .  Coronary artery disease   . Diabetes mellitus   . Hypertension   . Sleep apnea      Past Surgical History:  Procedure Laterality Date  . CORONARY ANGIOPLASTY    . LUNG LOBECTOMY      No Known Allergies  I have reviewed the patient's current medications . aspirin EC  81 mg Oral Daily  . carvedilol  3.125 mg Oral BID WC  . enoxaparin (LOVENOX) injection  40 mg Subcutaneous Q24H  . furosemide  40 mg Intravenous Daily  . insulin aspart  0-9 Units Subcutaneous TID WC  . insulin aspart  20 Units Subcutaneous BID WC  . insulin detemir  50 Units Subcutaneous BID  . ipratropium-albuterol  3 mL Nebulization QID  . linaclotide  290 mcg Oral QAC breakfast  . lisinopril  5 mg Oral Daily  . pantoprazole  40 mg Oral Daily  . [START ON 10/16/2016] predniSONE  50 mg Oral Q breakfast  . simvastatin  40 mg Oral Daily  . sodium chloride flush  3 mL Intravenous Q12H  . sucralfate  1 g Oral TID     acetaminophen **OR** acetaminophen, ipratropium-albuterol, lactulose, ondansetron **OR** ondansetron (ZOFRAN) IV, oxyCODONE-acetaminophen, polyethylene glycol, simethicone  Prior to Admission medications   Medication Sig Start Date End Date Taking? Authorizing Provider  albuterol (PROVENTIL HFA;VENTOLIN HFA) 108 (90 Base) MCG/ACT inhaler Inhale 2 puffs into the lungs every 4 (four) hours as needed for wheezing or shortness of breath.  08/04/16  Yes Historical Provider, MD  aspirin 81 MG EC tablet Take 81 mg by mouth daily.     Yes Historical Provider, MD  carvedilol (COREG) 3.125 MG tablet Take 3.125 mg by mouth 2 (two) times daily with a meal.   Yes Historical Provider, MD  Insulin Detemir ([name] FLEXPEN) 100 UNIT/ML Pen Inject 50 Units into the skin 2 (two) times daily.   Yes Historical Provider, MD  insulin lispro (HUMALOG KWIKPEN) 100 UNIT/ML KiwkPen Inject 20 Units into the skin 2 (two) times daily.   Yes Historical Provider, MD  lactulose, encephalopathy, (GENERLAC) 10 GM/15ML SOLN Take 10 g by  mouth 2 (two) times daily as needed (for constipation).   Yes Historical Provider, MD  linaclotide (LINZESS) 290 MCG CAPS capsule Take 290 mcg by mouth daily before breakfast.   Yes Historical Provider, MD  lisinopril (PRINIVIL,ZESTRIL) 5 MG tablet Take 5 mg by mouth daily.   Yes Historical Provider, MD  omeprazole (PRILOSEC) 40 MG capsule Take 40 mg by mouth daily.   Yes Historical Provider, MD  oxyCODONE-acetaminophen (PERCOCET/ROXICET) 5-325 MG tablet Take 0.5-1 tablets by mouth every 8 (eight) hours as needed (for pain).    Yes Historical Provider, MD  pantoprazole (PROTONIX) 40 MG tablet Take 40 mg by mouth daily. 10/04/16 11/03/16 Yes Historical Provider, MD  polyethylene glycol (MIRALAX / GLYCOLAX) packet Take 17 g by mouth daily as needed for constipation. 10/04/16 11/03/16 Yes Historical Provider, MD  simethicone (MYLICON) 80 MG chewable tablet Chew 80 mg by mouth every 6 (six) hours as needed for flatulence. 10/04/16 11/03/16 Yes Historical Provider, MD  simvastatin (ZOCOR) 40 MG tablet Take 40 mg by mouth daily.   Yes Historical Provider, MD  sitaGLIPtin-metformin ([name]) 50-1000 MG tablet Take 1 tablet by mouth every morning.   Yes Historical Provider, MD  sucralfate (CARAFATE) 1 g tablet Take 1 g by  mouth 3 (three) times daily.    Yes Historical Provider, MD  Vitamin D, Ergocalciferol, (DRISDOL) 50000 units CAPS capsule Take 50,000 Units by mouth every 7 (seven) days.   Yes Historical Provider, MD  Fluticasone-Salmeterol (ADVAIR DISKUS) 250-50 MCG/DOSE AEPB Inhale 1 puff into the lungs 2 (two) times daily.    Historical Provider, MD  furosemide (LASIX) 20 MG tablet Take 20 mg by mouth daily as needed for fluid or edema.    Historical Provider, MD  umeclidinium bromide (INCRUSE ELLIPTA) 62.5 MCG/INH AEPB Inhale 1 puff into the lungs daily. 10/04/16 11/03/16  Historical Provider, MD     Social History   Social History  . Marital status: Married    Spouse name: N/A  . Number of  children: N/A  . Years of education: N/A   Occupational History  . Not on file.   Social History Main Topics  . Smoking status: Current Every Day Smoker    Packs/day: 0.50  . Smokeless tobacco: Never Used  . Alcohol use No  . Drug use: No  . Sexual activity: Not on file   Other Topics Concern  . Not on file   Social History Narrative  . No narrative on file    Family Status  Relation Status  . Mother    Family History  Problem Relation Age of Onset  . Hypertension Mother      ROS:  Full 14 point review of systems complete and found to be negative unless listed above.  Physical Exam: Blood pressure (!) 102/53, pulse 99, temperature 97.7 F (36.5 C), temperature source Oral, resp. rate 20, height '5\' 7"'$  (1.702 m), weight 238 lb 4.8 oz (108.1 kg), SpO2 92 %.  General: Moderately obese female in no acute distress. Undergoing echo cardiogram Head: Eyes PERRLA, No xanthomas. Normocephalic and atraumatic, oropharynx without edema or exudate.  Lungs: Resp regular and unlabored, diminished breath  Heart: RRR no s3, s4, or murmurs..   Neck: No carotid bruits. No lymphadenopathy.  No JVD. Abdomen: Bowel sounds present, abdomen soft and non-tender without masses or hernias noted. Msk:  No spine or cva tenderness. No weakness, no joint deformities or effusions. Extremities: No clubbing, cyanosis or edema. DP/PT/Radials 2+ and equal bilaterally. Neuro: Alert and oriented X 3. No focal deficits noted. Psych:  Good affect, responds appropriately Skin: No rashes or lesions noted.  Labs:   Lab Results  Component Value Date   WBC 9.8 10/14/2016   HGB 11.7 (L) 10/14/2016   HCT 40.8 10/14/2016   MCV 91.1 10/14/2016   PLT 225 10/14/2016   No results for input(s): INR in the last 72 hours.  Recent Labs Lab 10/14/16 1249  NA 142  K 3.8  CL 104  CO2 29  BUN 9  CREATININE 0.81  CALCIUM 9.3  PROT 6.4*  BILITOT 0.7  ALKPHOS 64  ALT 20  AST 15  GLUCOSE 170*  ALBUMIN 2.9*    No results found for: MG  Recent Labs  10/14/16 0250 10/14/16 1249 10/14/16 1832  TROPONINI <0.03 0.03*  <0.03 0.03*    Recent Labs  10/13/16 1759  TROPIPOC 0.00   No results found for: PROBNP No results found for: CHOL, HDL, LDLCALC, TRIG Lab Results  Component Value Date   DDIMER 1.15 (H) 10/13/2016   Lipase  Date/Time Value Ref Range Status  10/13/2016 05:49 PM 22 11 - 51 U/L Final   No results found for: TSH, T4TOTAL, T3FREE, THYROIDAB No results found for: VITAMINB12, FOLATE, FERRITIN,  TIBC, IRON, RETICCTPCT  Echo: 7 /2017 Summary Mild left ventricular hypertrophy, Small LV cavity. No segmental abnormality. LVEF is noted to be 70% dilated right ventricle. Trace tricuspid regurgitation by color flow doppler examination. Moderate to severe pulmonary hypertension. RVSP 24mHg Signature ------------------------------------------------------------------------------  Electronically signed by ZMilana Huntsman MD(Interpreting physician) on  06/24/2016 12:10 PM ------------------------------------------------------------------------------ Findings Mitral Valve Normal mitral valve structure and mobility. No significant regurgitation. Aortic Valve Normal tricuspid aortic valve with pliable leaflets, no stenosis or insufficiency. Tricuspid Valve Tricuspid valve is structurally normal. Trace tricuspid regurgitation by color flow doppler examination. Moderate to severe pulmonary hypertension. RVSP 648mg Pulmonic Valve Normal pulmonic valve structure and mobility. Left Atrium Mildly dilated left atrium. Left Ventricle Mild left ventricular hypertrophy, Small LV cavity. No segmental abnormality. LVEF is noted to be 70% Right Atrium Mild Right atrial enlargement Right Ventricle dilated right ventricle. Pericardial Effusion No evidence of pericardial effusion. Miscellaneous The aorta is within normal limits. The IVC is normal and collapses  Cath  08/18/16 Conclusions Diagnostic Procedure Summary 1. Normal coronaries. Prior RCA stent is widely patent. 2. Mild Basal inferior wall hypokinesis, LVEF 55% Diagnostic Procedure Recommendations Medical therapy. NSTEMI likely type II due to COPD exacerbation I have reviewed the recent history and physical documentation. I personally spent 12 minutes continuously monitoring the patient during the administration of moderate sedation. Pre and post activities have been reviewed. I was present for the entire procedure.  Signatures  Electronically signed by AnRozann LeschesMD(Diagnostic Physician) on  08/20/2016 10:30  Angiographic findings  Cardiac Arteries and Lesion Findings LMCA: 0%. LAD: 0%. LCx: 0%. RCA: 0%.  Ejection Fraction - Method: LV gram. EF%: 55.  Hemodynamics  Condition: Rest  Heart Rate: 89 bpm Pressures +-----+------------+ !Site !Pressure! +-----+------------+ !LV !134/0 ,21 ! +-----+------------+ !AO !137/91 (111)! +-----+------------+ !LV !140/9 ,27 ! +-----+------------+ !AO !138/84 (109)! +-----+------------+ Valve Gradients and Areas +------+----+----+----+-----+----+------+ !Valve !Peak!Mean!Area!Index!Flow!Source! +------+----+----+----+-----+----+------+ !Aortic!3 !1539 !!! +------+----+----+----+-----+----+------+ !Aortic!3 !1561 !!! +------+----+----+----+-----+----+------+   Radiology:  Dg Abd 1 View  Result Date: 10/14/2016 CLINICAL DATA:  Bilateral abdominal pain EXAM: ABDOMEN - 1 VIEW COMPARISON:  10/03/2016, 10/02/2016 FINDINGS: Nonspecific nonobstructed gas pattern. No pathologic calcifications. IMPRESSION: Nonspecific nonobstructed gas pattern Electronically Signed   By: KiDonavan Foil.D.   On: 10/14/2016 01:57   Ct Angio Chest Pe W And/or Wo Contrast  Result Date: 10/13/2016 CLINICAL DATA:  Initial evaluation for increased oxygen demand, hypoxia. EXAM: CT ANGIOGRAPHY CHEST  WITH CONTRAST TECHNIQUE: Multidetector CT imaging of the chest was performed using the standard protocol during bolus administration of intravenous contrast. Multiplanar CT image reconstructions and MIPs were obtained to evaluate the vascular anatomy. CONTRAST:  90 cc of Isovue 370. COMPARISON:  Prior CT from 09/20/2016 as well as previous radiograph from earlier same day. FINDINGS: Cardiovascular: Intrathoracic aorta a grossly normal and of normal caliber. No definite aortic abnormality, although evaluation limited due to timing of the contrast bolus. Cardiomegaly is stable from prior. Right ventricular and right atrial enlargement with straightening of the intraventricular septum. No pericardial effusion. Pulmonary arterial tree adequately opacified for evaluation. Main pulmonary artery mildly dilated to 3.2 cm and transaxial diameter. No filling defect to suggest acute pulmonary embolism identified. Re-formatted imaging confirms these findings. From the data bowing for possible small distal emboli somewhat limited on CT moved respiratory motion artifact and body habitus. Mediastinum/Nodes: Visualized thyroid is normal. Scattered enlarged mediastinal lymph nodes measure up to approximately 2 cm in short access, not significantly changed relative to recent CT. Prevascular node measures 15 mm,  stable. Right hilar node measures 14 mm. Soft tissue fullness at the left hilum with 16 mm left hilar node also similar. Mildly prominent left axillary node measures up to 11 mm, similar to previous. No right axillary adenopathy. Esophagus within normal limits. Lungs/Pleura: Small layering bilateral pleural effusions. Diffuse interlobular septal thickening suggestive of pulmonary edema. 5 mm right upper lobe nodule is stable (series 6, image 32). Additional 10 x 9 mm nodular density within the posterior left upper lobe also stable (series 6, image 34). Unchanged 5 mm subpleural left upper lobe nodule (series 6, image 42). No  focal infiltrates. Linear atelectasis within the right middle lobe and lingula. No pneumothorax. Upper Abdomen: Visualized upper abdomen within normal limits. Musculoskeletal: No acute osseous abnormality. No worrisome lytic or blastic osseous lesions. Review of the MIP images confirms the above findings. IMPRESSION: 1. No CT evidence for acute pulmonary embolism. 2. Diffuse interlobular septal thickening, suggesting mild pulmonary interstitial edema. Small layering bilateral pleural effusions. 3. Dilatation of the main pulmonary artery to 3.2 cm, suggesting pulmonary hypertension. Right ventricular and right atrial dilatation with mild straightening of the intraventricular septum, suggestive of elevated right sided heart pressures. Findings are similar relative to recent CT. 4. Similar appearance of enlarged mediastinal, hilar, and left axillary lymph nodes as above. Again, while these findings may be related to underlying systemic disease such as sarcoidosis, possible lymphoproliferative disorder could also have this appearance. 5. Bilateral pulmonary nodules, stable relative to previous examinations, presumably benign. Electronically Signed   By: Jeannine Boga M.D.   On: 10/13/2016 22:24   Dg Chest Portable 1 View  Result Date: 10/13/2016 CLINICAL DATA:  60 year old female with history of acute on chronic shortness of breath today. Low oxygen saturations. EXAM: PORTABLE CHEST 1 VIEW COMPARISON:  Chest x-ray 09/30/2016. FINDINGS: There is cephalization of the pulmonary vasculature and slight indistinctness of the interstitial markings suggestive of mild pulmonary edema. Mild cardiomegaly trace bilateral pleural effusions. The patient is rotated to the right on today's exam, resulting in distortion of the mediastinal contours and reduced diagnostic sensitivity and specificity for mediastinal pathology. Atherosclerosis in the thoracic aorta. IMPRESSION: 1. The appearance of the chest suggests mild  congestive heart failure, as above. Electronically Signed   By: Vinnie Langton M.D.   On: 10/13/2016 17:44    ASSESSMENT AND PLAN:     1. Acute diastolic CHF - BNP of 948 on admission. Chest x-ray with vascular congestion. Echocardiogram 06/2016 at outside hospital showed ventricular function of 70%, mild LVH, dilated right ventricle, moderate to severe pulmonary hypertension, trace TR. - Echocardiogram has been done this admission pending reading. Seems her dyspnea more due to respiratory failure. May increase lasix for brief period of time. She does had elevated LV EDP on last cath, no R sided cath done.   2. Acute on chronic respiratory failure with hypoxia/acute on chronic COPD exacerbation in setting of moderate to severe pulmonary hypertension - Breathing has been improved. She is requiring 5 L of oxygen. On steroid and nebulizer. No PE on CT of chest.  3. Elevated troponin - Troponin of 0.03 x 2. EKG shows T-wave inversion in anterior lead. Which is new compared to EKG of 2013. However she had a reassuring cath in August 2017 which showed patent RCA stent and no other obstructive CAD. Likely demand in setting of acute exacerbation of COPD and2 CHF.  4. CAD s/p RCA stent - Recent cath as above.   5. PUD - Recent endoscopy by Dr. Ferdinand Lango  GI at [facility] showed chronic gastritis and peptic ulcers. Per primary.    SignedLeanor Kail, PA 10/15/2016, 2:42 PM Pager [phone]   Co-Sign MD

## 2016-10-15 NOTE — Progress Notes (Signed)
Called into patients room saying she was short of breath after laying down for 2D echo.  Oxygen saturation 85% on 6L high flow oxygen.  Patient now sitting up in bed, nebulizer given, and respiratory called.  Oxygen titrated up to 10L high flow oxygen with patient oxygen saturation between 93-94%.  Patient's primary nurse made aware and will continue to monitor.

## 2016-10-15 NOTE — Progress Notes (Signed)
  Echocardiogram 2D Echocardiogram has been performed.  Nancy Castillo 10/15/2016, 3:21 PM

## 2016-10-16 DIAGNOSIS — J441 Chronic obstructive pulmonary disease with (acute) exacerbation: Secondary | ICD-10-CM

## 2016-10-16 DIAGNOSIS — G4733 Obstructive sleep apnea (adult) (pediatric): Secondary | ICD-10-CM

## 2016-10-16 DIAGNOSIS — J9621 Acute and chronic respiratory failure with hypoxia: Secondary | ICD-10-CM

## 2016-10-16 DIAGNOSIS — I272 Pulmonary hypertension, unspecified: Secondary | ICD-10-CM

## 2016-10-16 LAB — GLUCOSE, CAPILLARY
GLUCOSE-CAPILLARY: 254 mg/dL — AB (ref 65–99)
GLUCOSE-CAPILLARY: 289 mg/dL — AB (ref 65–99)
GLUCOSE-CAPILLARY: 399 mg/dL — AB (ref 65–99)
Glucose-Capillary: 335 mg/dL — ABNORMAL HIGH (ref 65–99)
Glucose-Capillary: 230 mg/dL — ABNORMAL HIGH (ref 65–99)

## 2016-10-16 MED ORDER — PHENOL 1.4 % MT LIQD: 1.0000 | OROMUCOSAL | Status: DC | PRN

## 2016-10-16 MED ORDER — BUDESONIDE 0.25 MG/2ML IN SUSP
0.2500 mg | Freq: Two times a day (BID) | RESPIRATORY_TRACT | Status: DC
  Administered 2016-10-16: 0.25 mg via RESPIRATORY_TRACT
  Filled 2016-10-16: qty 2

## 2016-10-16 MED ORDER — MENTHOL 3 MG MT LOZG
1.0000 | LOZENGE | OROMUCOSAL | Status: DC | PRN
  Filled 2016-10-16 (×2): qty 9

## 2016-10-16 MED ORDER — FUROSEMIDE 10 MG/ML IJ SOLN
40.0000 mg | Freq: Once | INTRAMUSCULAR | Status: AC
  Administered 2016-10-16: 40 mg via INTRAVENOUS
  Filled 2016-10-16: qty 4

## 2016-10-16 MED ORDER — BUDESONIDE 0.5 MG/2ML IN SUSP
0.5000 mg | Freq: Two times a day (BID) | RESPIRATORY_TRACT | Status: DC
  Administered 2016-10-16 – 2016-10-22 (×12): 0.5 mg via RESPIRATORY_TRACT
  Filled 2016-10-16 (×13): qty 2

## 2016-10-16 NOTE — Evaluation (Signed)
Physical Therapy Evaluation Patient Details Name: Nancy Castillo MRN: [id number] DOB: July 25, 1956 Today's Date: 10/16/2016   History of Present Illness  60 year old obese female with COPD, chronic hypoxic respiratory failure on 3 L oxygen at home, CHF, pulmonary hypertension, CAD with stents, DM, PUD, presented to the ED with epigastric pain and shortness of breath.   Patient admitted for COPD exacerbation and acute on chronic respiratory failure.    Clinical Impression  Patient is functioning at independent to Mod I level with all mobility and gait. No acute PT needs identified - PT will sign off.   Patient with improved O2 sats after mobility/activity.  Encouraged patient to sit up for longer periods of time (reports she primarily lays in bed at home).    Follow Up Recommendations No PT follow up;Supervision - Intermittent    Equipment Recommendations  None recommended by PT    Recommendations for Other Services       Precautions / Restrictions Precautions Precautions: None Precaution Comments: On O2 at 6 L Restrictions Weight Bearing Restrictions: No      Mobility  Bed Mobility Overal bed mobility: Modified Independent             General bed mobility comments: Increased time  Transfers Overall transfer level: Independent Equipment used: None             General transfer comment: Patient transferred bed <> BSC with no assist.  Sitting EOB patient with O2 sat at 90%.  Following transfers, O2 sats dropped to 79%.  Returned to 90% within 90 seconds.  Patient moves sit <> stand with no assist and good balance.  Ambulation/Gait Ambulation/Gait assistance: Independent Ambulation Distance (Feet): 48 Feet Assistive device: None Gait Pattern/deviations: Step-through pattern;Decreased stride length Gait velocity: decreased Gait velocity interpretation: Below normal speed for age/gender General Gait Details: Patient with good gait pattern and balance.  Slow speed to  minimize DOE.  However, patient with 4/4 dyspnea following gait.  O2 sats at 87%.  Returned to 92% within 30 seconds.  O2 sats increased to 95% following rest.  Stairs            Wheelchair Mobility    Modified Rankin (Stroke Patients Only)       Balance Overall balance assessment: No apparent balance deficits (not formally assessed)                                           Pertinent Vitals/Pain Pain Assessment: Faces Faces Pain Scale: Hurts little more Pain Location: Abdomen  Pain Descriptors / Indicators: Discomfort (Difficult for patient to describe) Pain Intervention(s): Monitored during session;Repositioned    Home Living Family/patient expects to be discharged to:: Private residence Living Arrangements: Spouse/significant other Available Help at Discharge: Family;Available PRN/intermittently (Husband works 1am to [employer].  Otherwise with patient.) Type of Home: Apartment Home Access: Stairs to enter Entrance Stairs-Rails: None Entrance Stairs-Number of Steps: 3 Home Layout: One level Home Equipment: None      Prior Function Level of Independence: Independent;Needs assistance   Gait / Transfers Assistance Needed: Independent with gait/mobility  ADL's / Homemaking Assistance Needed: Independent with bathing, dressing.  Husband assists with set-up for cooking.        Hand Dominance        Extremity/Trunk Assessment   Upper Extremity Assessment: Overall WFL for tasks assessed  Lower Extremity Assessment: Overall WFL for tasks assessed         Communication   Communication: No difficulties  Cognition Arousal/Alertness: Awake/alert Behavior During Therapy: WFL for tasks assessed/performed Overall Cognitive Status: Within Functional Limits for tasks assessed                      General Comments      Exercises     Assessment/Plan    PT Assessment Patent does not need any further PT services  PT Problem  List            PT Treatment Interventions      PT Goals (Current goals can be found in the Care Plan section)  Acute Rehab PT Goals PT Goal Formulation: All assessment and education complete, DC therapy    Frequency     Barriers to discharge        Co-evaluation               End of Session Equipment Utilized During Treatment: Gait belt;Oxygen Activity Tolerance: Patient tolerated treatment well;Patient limited by fatigue Patient left: in bed;with call bell/phone within reach;Other (comment) (Patient sitting EOB; NP in room) Nurse Communication: Mobility status         Time: 1029-1050 PT Time Calculation (min) (ACUTE ONLY): 21 min   Charges:   PT Evaluation $PT Eval Moderate Complexity: 1 Procedure     PT G CodesDespina Pole October 31, 2016, 1:23 PM Carita Pian. Sanjuana Kava, [place] Pager [phone]

## 2016-10-16 NOTE — Consult Note (Signed)
Name: Nancy Castillo MRN: [id number] DOB: 19-Jan-1956    ADMISSION DATE:  10/13/2016 CONSULTATION DATE:  10/16/16  REFERRING MD :  Dr. Clementeen Graham   CHIEF COMPLAINT:  SOB    HISTORY OF PRESENT ILLNESS:  60 y/o F, former smoker (quit August 2017) with PMH of DM, HTN, CAD s/p stent to RCA, OSA - unable to get CPAP due to financial balance, COPD, chronic hypoxic respiratory failure on 3.5-4L at baseline (followed by Dr. [name] at [facility]), pulmonary hypertension, pulmonary nodules (ANA is 1:160 speckled, she has arthritis per Dr. [name]) and prior lobectomy [facility], Dr. Lianne Moris, RML lobectomy 2013) who was admitted on 10/24 with what she describes as "abdominal pain".  She reports her baseline is essentially lying in bed all day.  She "does not get up for much due to shortness of breath and feeling tired".  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   On a "good day" she mostly lays in bed because when she gets up she gets short of breath with activity and has "stomach pain".  She feels tired all the time.    The patient presented to [facility] on 10/24 with "abdominal pain".  When asked to point out where she hurts, she points to her left chest and mid-epigastric area.  She denies fevers, chills, chest pain, pain with inspiration.  The patient was found to be hypoxic on admit requiring increased O2.  She denies current issues with her breathing. The patient just participated with PT and was able to ambulate without difficulty.  Her observed limiting factor is shortness of breath.  She reports abdominal bloating and LE swelling at times.  She reports she is on multiple pulmonary agents > some overlapping from recent discharge at [facility].  Currently using PRN albuterol, Breo, Advair and incruse.  However, she was unable to get Incruse due to no prescription. She does not wear CPAP due to financial issues.   Of note, she was last seen at [facility]iology clinic and weight noted to be 230lbs.    PCCM consulted 10/27 for acute on chronic  hypoxic respiratory failure.   PAST MEDICAL HISTORY :   has a past medical history of Asthma; COPD (chronic obstructive pulmonary disease) ([place]); Coronary artery disease; Diabetes mellitus; Hypertension; and Sleep apnea.  has a past surgical history that includes Coronary angioplasty and Lung lobectomy.    Prior to Admission medications   Medication Sig Start Date End Date Taking? Authorizing Provider  albuterol (PROVENTIL HFA;VENTOLIN HFA) 108 (90 Base) MCG/ACT inhaler Inhale 2 puffs into the lungs every 4 (four) hours as needed for wheezing or shortness of breath.  08/04/16  Yes Historical Provider, MD  aspirin 81 MG EC tablet Take 81 mg by mouth daily.     Yes Historical Provider, MD  carvedilol (COREG) 3.125 MG tablet Take 3.125 mg by mouth 2 (two) times daily with a meal.   Yes Historical Provider, MD  Insulin Detemir ([name] FLEXPEN) 100 UNIT/ML Pen Inject 50 Units into the skin 2 (two) times daily.   Yes Historical Provider, MD  insulin lispro (HUMALOG KWIKPEN) 100 UNIT/ML KiwkPen Inject 20 Units into the skin 2 (two) times daily.   Yes Historical Provider, MD  lactulose, encephalopathy, (GENERLAC) 10 GM/15ML SOLN Take 10 g by mouth 2 (two) times daily as needed (for constipation).   Yes Historical Provider, MD  linaclotide (LINZESS) 290 MCG CAPS capsule Take 290 mcg by mouth daily before breakfast.   Yes Historical Provider, MD  lisinopril (PRINIVIL,ZESTRIL) 5 MG tablet  Take 5 mg by mouth daily.   Yes Historical Provider, MD  omeprazole (PRILOSEC) 40 MG capsule Take 40 mg by mouth daily.   Yes Historical Provider, MD  oxyCODONE-acetaminophen (PERCOCET/ROXICET) 5-325 MG tablet Take 0.5-1 tablets by mouth every 8 (eight) hours as needed (for pain).    Yes Historical Provider, MD  pantoprazole (PROTONIX) 40 MG tablet Take 40 mg by mouth daily. 10/04/16 11/03/16 Yes Historical Provider, MD  polyethylene glycol (MIRALAX / GLYCOLAX) packet Take 17 g by mouth daily as needed for constipation.  10/04/16 11/03/16 Yes Historical Provider, MD  simethicone (MYLICON) 80 MG chewable tablet Chew 80 mg by mouth every 6 (six) hours as needed for flatulence. 10/04/16 11/03/16 Yes Historical Provider, MD  simvastatin (ZOCOR) 40 MG tablet Take 40 mg by mouth daily.   Yes Historical Provider, MD  sitaGLIPtin-metformin ([name]) 50-1000 MG tablet Take 1 tablet by mouth every morning.   Yes Historical Provider, MD  sucralfate (CARAFATE) 1 g tablet Take 1 g by mouth 3 (three) times daily.    Yes Historical Provider, MD  Vitamin D, Ergocalciferol, (DRISDOL) 50000 units CAPS capsule Take 50,000 Units by mouth every 7 (seven) days.   Yes Historical Provider, MD  Fluticasone-Salmeterol (ADVAIR DISKUS) 250-50 MCG/DOSE AEPB Inhale 1 puff into the lungs 2 (two) times daily.    Historical Provider, MD  furosemide (LASIX) 20 MG tablet Take 20 mg by mouth daily as needed for fluid or edema.    Historical Provider, MD  umeclidinium bromide (INCRUSE ELLIPTA) 62.5 MCG/INH AEPB Inhale 1 puff into the lungs daily. 10/04/16 11/03/16  Historical Provider, MD   No Known Allergies  FAMILY HISTORY:  family history includes Hypertension in her mother.   SOCIAL HISTORY:  reports that she has been smoking.  She has been smoking about 0.50 packs per day. She has never used smokeless tobacco. She reports that she does not drink alcohol or use drugs.  REVIEW OF SYSTEMS:  POSITIVES IN BOLD Constitutional: Negative for fever, chills, weight loss, malaise/fatigue and diaphoresis.  HENT: Negative for hearing loss, ear pain, nosebleeds, congestion, sore throat, neck pain, tinnitus and ear discharge.   Eyes: Negative for blurred vision, double vision, photophobia, pain, discharge and redness.  Respiratory: Negative for cough, hemoptysis, sputum production, shortness of breath, wheezing and stridor.   Cardiovascular: Negative for chest pain, palpitations, orthopnea, claudication, leg swelling and PND.  Gastrointestinal: Negative  for heartburn, nausea, vomiting, abdominal pain, diarrhea, constipation, blood in stool and melena.  Genitourinary: Negative for dysuria, urgency, frequency, hematuria and flank pain.  Musculoskeletal: Negative for myalgias, back pain, joint pain and falls.  Skin: Negative for itching and rash.  Neurological: Negative for dizziness, tingling, tremors, sensory change, speech change, focal weakness, seizures, loss of consciousness, weakness and headaches.  Endo/Heme/Allergies: Negative for environmental allergies and polydipsia. Does not bruise/bleed easily.  SUBJECTIVE:   VITAL SIGNS: Temp:  [97.7 F (36.5 C)-99.1 F (37.3 C)] 99.1 F (37.3 C) (10/27 0537) Pulse Rate:  [92-100] 92 (10/27 0537) Resp:  [18-20] 18 (10/27 0537) BP: (102-135)/(53-78) 132/72 (10/27 0537) SpO2:  [86 %-94 %] 94 % (10/27 0748) Weight:  [237 lb (107.5 kg)-238 lb 4.8 oz (108.1 kg)] 237 lb (107.5 kg) (10/27 0537)  PHYSICAL EXAMINATION: General:  Morbidly obese female in NAD, working with PT Neuro:  AAOx4, speech clear, MAE, good strength PSY: pleasant / cooperative  HEENT:  MM pink/moist, no jvd Cardiovascular:  s1s2 rrr, no m/r/g Lungs:  Even/non-labored, lungs bilaterally diminished but clear  Abdomen:  Obese/soft,  bsx4 active  Musculoskeletal:  No acute deformities  Skin:  Warm/dry, no edema    Recent Labs Lab 10/13/16 1749 10/14/16 1249  NA 139 142  K 4.1 3.8  CL 105 104  CO2 24 29  BUN 7 9  CREATININE 0.76 0.81  GLUCOSE 213* 170*    Recent Labs Lab 10/13/16 1749 10/14/16 1249  HGB 12.1 11.7*  HCT 40.7 40.8  WBC 9.9 9.8  PLT 254 225   No results found.   SIGNIFICANT EVENTS  10/24  Admit to [facility] with complaints of ABD pain, found to be hypoxic 10/27  PCCM consulted with acute on chronic hypoxic respiratory failure   STUDIES:  CT ABD/Pelvis 10/02/16 >> focal area of low attenuation within the inferior aspect of the liver, may represent evolution of a subacute to chronic hematoma,  edematous gallbladder likely due to underlying liver disease, fibroid uterus  RUQ [place] 10/14 /17 >> normal gallbladder, wall thickening on CT may have been due to gallbladder contraction, ? Resolving liver laceration CTA Chest 09/30/16 >> no PE, dilation of the pulmonic trunk, suggestive of PAH, cardiomegaly with concerns of elevated R heart pressures, mild pulmonary edema, worsening LAN in the mediastinal & hilar nodes, all pulmonary nodules stable compared to prior exams (read by Madie Reno)  ASSESSMENT / PLAN:  Acute on Chronic Hypoxic Respiratory Failure - in the setting of moderate to severe pulmonary hypertension, COPD, ongoing tobacco abuse, CAD, non-compliance with CPAP. Baseline hypercapnia.  She often increases O2 up to 6L at home.  Doubt she is far from her baseline.    Plan: O2 goal 90% Pulmonary hygiene - IS, mobilize  PT Intermittent CXR  Moderate to Severe Pulmonary Hypertension - based on ECHO, mild interstitial edema on CXR  Plan: Consider RHC to evaluate pulmonary pressures.  Weight up 7 lbs since last office visit.  Areta Haber R heart failure Consider evaluation by advance heart failure services, will defer to primary  Additional dose lasix this pm  Monitor I/O's   Severe COPD - unable to find baseline PFT's but per notes at [facility] > spirometry shows severe obstruction with low FVC.    Plan: Discharge regimen > Breo, Incruse and PRN albuterol.  She will need prescriptions for above at discharge  Consider repeat PFT's with primary pulmonologist, ? Severity of obstructive disease vs PH as contributing to symptoms Follow up with Dr. [name] at [facility] at discharge  Adjust budesonide dosing Continue duonebs  OSA - noncompliant with CPAP due to financial issues   Plan: Autoset CPAP QHS  Will ask CM to assess situation with [facility] > reportedly owes 500$ balance before she can obtain machine   Pulmonary Nodules - worked up by [facility] for autoimmune disease, ANA is 1:160 speckled,  she has arthritis, RF <8.6 06/22/16.  HIV negative.  Hx of RML lobectomy / VATS in 2013 with benign findings  Plan: Monitor for now ? Underlying autoimmune process contributing to pulmonary nodules  Recent Tobacco Abuse - quit 07/2016 per report   Plan: Smoking cessation counseling    Noe Gens, NP-C [facility] Pgr: [phone] or if no answer [phone] 10/16/2016, 10:45 AM  Attending Note:  60 year old female with severe pulmonary HTN and airflow obstruction on PFTs.  On exam, distant air movement but clear.  No pedal edema.  I reviewed chest CT myself, pulmonary nodules noted.  Discussed with TR[facility]-MD and PCCM-NP.  Hypoxemic respiratory failure:  - Titrate O2 for sat of 90-92%.  -  Ambulate to check O2 need with activity at home.  Pulmonary HTN:  - Will need a right heart cath with reactivity study  - Will need treatment but not til reactivity is studied.  - Target SaO2 of 90.  OSA:  - CPAP  - Will need assistance with obtaining one outside the hospital.  COPD:  - No need for steroids from a COPD standpoint.  - Bronchodilators adjusted as above.  Patient seen and examined, agree with above note.  I dictated the care and orders written for this patient under my direction.  Rush Farmer, MD [phone]

## 2016-10-16 NOTE — Progress Notes (Signed)
Inpatient Diabetes Program Recommendations  AACE/ADA: New Consensus Statement on Inpatient Glycemic Control (2015)  Target Ranges:  Prepandial:   less than 140 mg/dL      Peak postprandial:   less than 180 mg/dL (1-2 hours)      Critically ill patients:  140 - 180 mg/dL   Results for Nancy Castillo, Nancy Castillo (MRN [id number]) as of 10/16/2016 11:17  Ref. Range 10/15/2016 07:57 10/15/2016 13:46 10/15/2016 16:26 10/15/2016 20:57 10/16/2016 00:12 10/16/2016 05:28  Glucose-Capillary Latest Ref Range: 65 - 99 mg/dL 314 (H) 336 (H) 385 (H) 420 (H) 399 (H) 254 (H)   Review of Glycemic Control  Current orders for Inpatient glycemic control: [name] 50 units BID, Novolog 0-15 units TID with meals, Novolog 20 units BID with meals  Inpatient Diabetes Program Recommendations:  Insulin - Basal: Glucose ranged from 254-420 mg/dl over the past 24 hours. Please consider increasing [name] to 54 units BID. Correction (SSI): Please order Novolog bedtime correction scale. Insulin - Meal Coverage: Please consider changing Novolog 20 units BID to Novolog 20 units TID with meals (so patient will get with all 3 meals).  Thanks, Barnie Alderman, RN, MSN, CDE Diabetes Coordinator Inpatient Diabetes Program [phone] (Team Pager from 8am to 5pm)

## 2016-10-16 NOTE — Progress Notes (Signed)
PROGRESS NOTE                                                                                                                                                                                                             Patient Demographics:    Nancy Castillo, is a 60 y.o. female, DOB - 1956-07-20, GNF:[id number]  Admit date - 10/13/2016   Admitting Physician Rise Patience, MD  Outpatient Primary MD for the patient is No primary care provider on file.  LOS - 2  Outpatient Specialists: Pulmonologist in [place] Complaint  Patient presents with  . Abdominal Pain       Brief Narrative   60 year old obese female with COPD, chronic hypoxic respiratory failure on 3 L oxygen at home, pulmonary hypertension, CAD presented to the ED with epigastric pain and shortness of breath. She was recently hospitalized at [facility] for COPD exacerbation. CT angiogram of the chest was negative for PE but showed diffuse septal thickening suggestive of mild pulmonary interstitial edema and pulmonary hypertension. Patient admitted for COPD exacerbation and acute on chronic respiratory failure.   Subjective:    Patient desaturating to 80s requiring 6 L via high flow nasal cannula.   Assessment  & Plan :    Principal Problem:   Acute on chronic respiratory failure with hypoxia (HCC) Requiring 6 L via high flow. Continue scheduled DuoNeb's. We will add Pulmicort nebulizer twice a day, pulmonary toilet and flutter valve. Solu-Medrol transitioned to oral prednisone today.  Acute cor pulmonale with severe pulmonary hypertension Echo shows severe pulmonary hypertension and possibly acute right heart failure. Pulmonary consulted for evaluation and further recommendations. Cardoza consult appreciated Continue daily IV Lasix.  Active Problems:   PUD (peptic ulcer disease) Patient reports having EGD 8 months back showing  gastric ulcer. Still complains of some epigastric pain. Continue PPI and GI cocktail.   OSA Recommended CPAP at home but patient has not been able to afford them. Using CPAP here. I will ask case manager to assist with this.    Uncontrolled diabetes mellitus without complication, with long-term current use of insulin (HCC) CBG elevated with use of steroid. Will increase [name] to 54 units and aspart 20 units 3 times a day.    Benign essential HTN Continue home medication  Dyslipidemia Continue simvastatin.  Code Status : full code  Family Communication  : None at bedside  Disposition Plan  : Home once respiratory function improved  Barriers For Discharge : Active symptoms  Consults  : Cardiology Pulmonary  Procedures  :2-D echo  DVT Prophylaxis  :  Lovenox   Lab Results  Component Value Date   PLT 225 10/14/2016    Antibiotics  :   Anti-infectives    None        Objective:   Vitals:   10/16/16 0018 10/16/16 0537 10/16/16 0748 10/16/16 1116  BP: 135/78 132/72  117/76  Pulse: 94 92  97  Resp: '18 18  18  '$ Temp:  99.1 F (37.3 C)  98.1 F (36.7 C)  TempSrc:  Oral  Oral  SpO2: 91% 91% 94% (!) 89%  Weight:  107.5 kg (237 lb)    Height:        Wt Readings from Last 3 Encounters:  10/16/16 107.5 kg (237 lb)  03/13/12 100.7 kg (222 lb)     Intake/Output Summary (Last 24 hours) at 10/16/16 1125 Last data filed at 10/16/16 0935  Gross per 24 hour  Intake             1440 ml  Output             1551 ml  Net             -111 ml     Physical Exam  Gen: Obese female not in distress HEENT: no pallor, moist mucosa, supple neck Chest: Scattered rhonchi bilaterally CVS: N S1&S2, no murmurs,  GI: soft,  ND, BS+, epigastric tenderness Musculoskeletal: warm, no edema     Data Review:    CBC  Recent Labs Lab 10/13/16 1749 10/14/16 1249  WBC 9.9 9.8  HGB 12.1 11.7*  HCT 40.7 40.8  PLT 254 225  MCV 88.1 91.1  MCH 26.2 26.1  MCHC  29.7* 28.7*  RDW 18.3* 18.6*  LYMPHSABS  --  1.8  MONOABS  --  0.4  EOSABS  --  0.1  BASOSABS  --  0.0    Chemistries   Recent Labs Lab 10/13/16 1749 10/14/16 1249  NA 139 142  K 4.1 3.8  CL 105 104  CO2 24 29  GLUCOSE 213* 170*  BUN 7 9  CREATININE 0.76 0.81  CALCIUM 9.1 9.3  AST 23 15  ALT 23 20  ALKPHOS 72 64  BILITOT 0.7 0.7   ------------------------------------------------------------------------------------------------------------------ No results for input(s): CHOL, HDL, LDLCALC, TRIG, CHOLHDL, LDLDIRECT in the last 72 hours.  No results found for: HGBA1C ------------------------------------------------------------------------------------------------------------------ No results for input(s): TSH, T4TOTAL, T3FREE, THYROIDAB in the last 72 hours.  Invalid input(s): FREET3 ------------------------------------------------------------------------------------------------------------------ No results for input(s): VITAMINB12, FOLATE, FERRITIN, TIBC, IRON, RETICCTPCT in the last 72 hours.  Coagulation profile No results for input(s): INR, PROTIME in the last 168 hours.   Recent Labs  10/13/16 1749  DDIMER 1.15*    Cardiac Enzymes  Recent Labs Lab 10/14/16 0250 10/14/16 1249 10/14/16 1832  TROPONINI <0.03 0.03*  <0.03 0.03*   ------------------------------------------------------------------------------------------------------------------    Component Value Date/Time   BNP 328.2 (H) 10/13/2016 1749    Inpatient Medications  Scheduled Meds: . aspirin EC  81 mg Oral Daily  . budesonide (PULMICORT) nebulizer solution  0.25 mg Nebulization BID  . carvedilol  3.125 mg Oral BID WC  . enoxaparin (LOVENOX) injection  40 mg Subcutaneous Q24H  . furosemide  40 mg Intravenous Daily  . insulin aspart  0-15 Units Subcutaneous TID WC  . insulin aspart  20 Units Subcutaneous BID WC  . insulin detemir  50 Units Subcutaneous BID  . ipratropium-albuterol  3 mL  Nebulization QID  . linaclotide  290 mcg Oral QAC breakfast  . lisinopril  5 mg Oral Daily  . pantoprazole  40 mg Oral Daily  . predniSONE  50 mg Oral Q breakfast  . simvastatin  40 mg Oral Daily  . sodium chloride flush  3 mL Intravenous Q12H  . sucralfate  1 g Oral TID   Continuous Infusions:  PRN Meds:.acetaminophen **OR** acetaminophen, ipratropium-albuterol, lactulose, menthol-cetylpyridinium, ondansetron **OR** ondansetron (ZOFRAN) IV, oxyCODONE-acetaminophen, phenol, polyethylene glycol, simethicone  Micro Results Recent Results (from the past 240 hour(s))  MRSA PCR Screening     Status: None   Collection Time: 10/14/16  1:55 AM  Result Value Ref Range Status   MRSA by PCR NEGATIVE NEGATIVE Final    Comment:        The GeneXpert MRSA Assay (FDA approved for NASAL specimens only), is one component of a comprehensive MRSA colonization surveillance program. It is not intended to diagnose MRSA infection nor to guide or monitor treatment for MRSA infections.     Radiology Reports Dg Abd 1 View  Result Date: 10/14/2016 CLINICAL DATA:  Bilateral abdominal pain EXAM: ABDOMEN - 1 VIEW COMPARISON:  10/03/2016, 10/02/2016 FINDINGS: Nonspecific nonobstructed gas pattern. No pathologic calcifications. IMPRESSION: Nonspecific nonobstructed gas pattern Electronically Signed   By: Donavan Foil M.D.   On: 10/14/2016 01:57   Ct Angio Chest Pe W And/or Wo Contrast  Result Date: 10/13/2016 CLINICAL DATA:  Initial evaluation for increased oxygen demand, hypoxia. EXAM: CT ANGIOGRAPHY CHEST WITH CONTRAST TECHNIQUE: Multidetector CT imaging of the chest was performed using the standard protocol during bolus administration of intravenous contrast. Multiplanar CT image reconstructions and MIPs were obtained to evaluate the vascular anatomy. CONTRAST:  90 cc of Isovue 370. COMPARISON:  Prior CT from 09/20/2016 as well as previous radiograph from earlier same day. FINDINGS: Cardiovascular:  Intrathoracic aorta a grossly normal and of normal caliber. No definite aortic abnormality, although evaluation limited due to timing of the contrast bolus. Cardiomegaly is stable from prior. Right ventricular and right atrial enlargement with straightening of the intraventricular septum. No pericardial effusion. Pulmonary arterial tree adequately opacified for evaluation. Main pulmonary artery mildly dilated to 3.2 cm and transaxial diameter. No filling defect to suggest acute pulmonary embolism identified. Re-formatted imaging confirms these findings. From the data bowing for possible small distal emboli somewhat limited on CT moved respiratory motion artifact and body habitus. Mediastinum/Nodes: Visualized thyroid is normal. Scattered enlarged mediastinal lymph nodes measure up to approximately 2 cm in short access, not significantly changed relative to recent CT. Prevascular node measures 15 mm, stable. Right hilar node measures 14 mm. Soft tissue fullness at the left hilum with 16 mm left hilar node also similar. Mildly prominent left axillary node measures up to 11 mm, similar to previous. No right axillary adenopathy. Esophagus within normal limits. Lungs/Pleura: Small layering bilateral pleural effusions. Diffuse interlobular septal thickening suggestive of pulmonary edema. 5 mm right upper lobe nodule is stable (series 6, image 32). Additional 10 x 9 mm nodular density within the posterior left upper lobe also stable (series 6, image 34). Unchanged 5 mm subpleural left upper lobe nodule (series 6, image 42). No focal infiltrates. Linear atelectasis within the right middle lobe and lingula. No pneumothorax. Upper Abdomen: Visualized upper abdomen within normal limits. Musculoskeletal: No acute osseous  abnormality. No worrisome lytic or blastic osseous lesions. Review of the MIP images confirms the above findings. IMPRESSION: 1. No CT evidence for acute pulmonary embolism. 2. Diffuse interlobular septal  thickening, suggesting mild pulmonary interstitial edema. Small layering bilateral pleural effusions. 3. Dilatation of the main pulmonary artery to 3.2 cm, suggesting pulmonary hypertension. Right ventricular and right atrial dilatation with mild straightening of the intraventricular septum, suggestive of elevated right sided heart pressures. Findings are similar relative to recent CT. 4. Similar appearance of enlarged mediastinal, hilar, and left axillary lymph nodes as above. Again, while these findings may be related to underlying systemic disease such as sarcoidosis, possible lymphoproliferative disorder could also have this appearance. 5. Bilateral pulmonary nodules, stable relative to previous examinations, presumably benign. Electronically Signed   By: Jeannine Boga M.D.   On: 10/13/2016 22:24   Dg Chest Portable 1 View  Result Date: 10/13/2016 CLINICAL DATA:  60 year old female with history of acute on chronic shortness of breath today. Low oxygen saturations. EXAM: PORTABLE CHEST 1 VIEW COMPARISON:  Chest x-ray 09/30/2016. FINDINGS: There is cephalization of the pulmonary vasculature and slight indistinctness of the interstitial markings suggestive of mild pulmonary edema. Mild cardiomegaly trace bilateral pleural effusions. The patient is rotated to the right on today's exam, resulting in distortion of the mediastinal contours and reduced diagnostic sensitivity and specificity for mediastinal pathology. Atherosclerosis in the thoracic aorta. IMPRESSION: 1. The appearance of the chest suggests mild congestive heart failure, as above. Electronically Signed   By: Vinnie Langton M.D.   On: 10/13/2016 17:44    Time Spent in minutes 35   Louellen Molder M.D on 10/16/2016 at 11:25 AM  Between 7am to 7pm - Pager - [phone]  After 7pm go to [email or web address] - password [facility]  [facility] -  Office  [phone]

## 2016-10-17 DIAGNOSIS — I50810 Right heart failure, unspecified: Secondary | ICD-10-CM

## 2016-10-17 LAB — BASIC METABOLIC PANEL
ANION GAP: 10 (ref 5–15)
BUN: 22 mg/dL — ABNORMAL HIGH (ref 6–20)
CHLORIDE: 96 mmol/L — AB (ref 101–111)
CO2: 28 mmol/L (ref 22–32)
CREATININE: 1.01 mg/dL — AB (ref 0.44–1.00)
Calcium: 9.7 mg/dL (ref 8.9–10.3)
GFR calc non Af Amer: >60 mL/min (ref >60)
Glucose, Bld: 420 mg/dL — ABNORMAL HIGH (ref 65–99)
POTASSIUM: 4.6 mmol/L (ref 3.5–5.1)
Sodium: 134 mmol/L — ABNORMAL LOW (ref 135–145)

## 2016-10-17 LAB — GLUCOSE, CAPILLARY
GLUCOSE-CAPILLARY: 471 mg/dL — AB (ref 65–99)
Glucose-Capillary: 167 mg/dL — ABNORMAL HIGH (ref 65–99)
Glucose-Capillary: 218 mg/dL — ABNORMAL HIGH (ref 65–99)
Glucose-Capillary: 211 mg/dL — ABNORMAL HIGH (ref 65–99)
Glucose-Capillary: 390 mg/dL — ABNORMAL HIGH (ref 65–99)

## 2016-10-17 MED ORDER — FUROSEMIDE 10 MG/ML IJ SOLN
40.0000 mg | Freq: Two times a day (BID) | INTRAMUSCULAR | Status: DC
  Administered 2016-10-17 – 2016-10-18 (×3): 40 mg via INTRAVENOUS
  Filled 2016-10-17 (×3): qty 4

## 2016-10-17 MED ORDER — POTASSIUM CHLORIDE CRYS ER 20 MEQ PO TBCR
40.0000 meq | EXTENDED_RELEASE_TABLET | Freq: Every day | ORAL | Status: DC
  Administered 2016-10-17 – 2016-10-20 (×4): 40 meq via ORAL
  Filled 2016-10-17 (×4): qty 2

## 2016-10-17 NOTE — Procedures (Signed)
Pt is refusing cpap will inform RT if anything changes. RT will continue to monitor.

## 2016-10-17 NOTE — Progress Notes (Addendum)
PROGRESS NOTE                                                                                                                                                                                                             Patient Demographics:    Nancy Castillo, is a 60 y.o. female, DOB - Jul 01, 1956, TKZ:[id number]  Admit date - 10/13/2016   Admitting Physician Rise Patience, MD  Outpatient Primary MD for the patient is No primary care provider on file.  LOS - 3  Outpatient Specialists: Pulmonologist in [facility]  Chief Complaint  Patient presents with  . Abdominal Pain       Brief Narrative   60 year old obese female with COPD, chronic hypoxic respiratory failure on 3 L oxygen at home, pulmonary hypertension, CAD presented to the ED with epigastric pain and shortness of breath. She was recently hospitalized at [facility] for COPD exacerbation. CT angiogram of the chest was negative for PE but showed diffuse septal thickening suggestive of mild pulmonary interstitial edema and pulmonary hypertension. Patient admitted for COPD exacerbation and acute on chronic respiratory failure.   Subjective:    maintaining O2 sat on high flow nasal cannula.informs breathing to be better.   Assessment  & Plan :    Principal Problem:   Acute on chronic respiratory failure with hypoxia (HCC)  Continue scheduled DuoNeb's. added Pulmicort nebulizer twice a day, pulmonary toilet and flutter valve.  discontinued steroid.  Titrate oxygen as tolerated.Continuous pulse ox at bedside.   Acute cor pulmonale with severe pulmonary hypertension Echo shows severe pulmonary hypertension and possibly acute right heart failure. appreciate pulm recs. cardiology consulted to evaluate for RHC. Continue daily IV Lasix.  Active Problems:   PUD (peptic ulcer disease) Patient reports having EGD 8 months back showing gastric ulcer. Still  complains of some epigastric pain. Continue PPI and GI cocktail.   OSA Recommended CPAP at home but patient has not been able to afford them. Using CPAP here. CM consult for assistance.    Uncontrolled diabetes mellitus without complication, with long-term current use of insulin (HCC) CBG elevated with use of steroid.  increased [name] to 54 units and aspart 20 units 3 times a day.    Benign essential HTN Continue home medication  Dyslipidemia Continue simvastatin.      Code Status : full  code  Family Communication  : None at bedside  Disposition Plan  : Home once respiratory function improved, pending cardiology evaluation for RHC  Barriers For Discharge : Active symptoms  Consults  : Cardiology Pulmonary  Procedures  :2-D echo  DVT Prophylaxis  :  Lovenox   Lab Results  Component Value Date   PLT 225 10/14/2016    Antibiotics  :   Anti-infectives    None        Objective:   Vitals:   10/16/16 2005 10/16/16 2239 10/17/16 0552 10/17/16 0835  BP: (!) 142/73 106/70 112/73   Pulse: 91 95 91   Resp: (!) '22 19 20   '$ Temp:  97.6 F (36.4 C) 98 F (36.7 C)   TempSrc:  Oral Oral   SpO2:  92% 91% 93%  Weight:   105.5 kg (232 lb 9.6 oz)   Height:        Wt Readings from Last 3 Encounters:  10/17/16 105.5 kg (232 lb 9.6 oz)  03/13/12 100.7 kg (222 lb)     Intake/Output Summary (Last 24 hours) at 10/17/16 1114 Last data filed at 10/17/16 1009  Gross per 24 hour  Intake              466 ml  Output             3650 ml  Net            -3184 ml     Physical Exam  Gen: Obese female not in distress HEENT:  moist mucosa, supple neck Chest: Improved breath sounds bilaterally CVS: N S1&S2, no murmurs,  GI: soft,  ND, BS+, non tender Musculoskeletal: warm, no edema     Data Review:    CBC  Recent Labs Lab 10/13/16 1749 10/14/16 1249  WBC 9.9 9.8  HGB 12.1 11.7*  HCT 40.7 40.8  PLT 254 225  MCV 88.1 91.1  MCH 26.2 26.1  MCHC 29.7*  28.7*  RDW 18.3* 18.6*  LYMPHSABS  --  1.8  MONOABS  --  0.4  EOSABS  --  0.1  BASOSABS  --  0.0    Chemistries   Recent Labs Lab 10/13/16 1749 10/14/16 1249  NA 139 142  K 4.1 3.8  CL 105 104  CO2 24 29  GLUCOSE 213* 170*  BUN 7 9  CREATININE 0.76 0.81  CALCIUM 9.1 9.3  AST 23 15  ALT 23 20  ALKPHOS 72 64  BILITOT 0.7 0.7   ------------------------------------------------------------------------------------------------------------------ No results for input(s): CHOL, HDL, LDLCALC, TRIG, CHOLHDL, LDLDIRECT in the last 72 hours.  No results found for: HGBA1C ------------------------------------------------------------------------------------------------------------------ No results for input(s): TSH, T4TOTAL, T3FREE, THYROIDAB in the last 72 hours.  Invalid input(s): FREET3 ------------------------------------------------------------------------------------------------------------------ No results for input(s): VITAMINB12, FOLATE, FERRITIN, TIBC, IRON, RETICCTPCT in the last 72 hours.  Coagulation profile No results for input(s): INR, PROTIME in the last 168 hours.  No results for input(s): DDIMER in the last 72 hours.  Cardiac Enzymes  Recent Labs Lab 10/14/16 0250 10/14/16 1249 10/14/16 1832  TROPONINI <0.03 0.03*  <0.03 0.03*   ------------------------------------------------------------------------------------------------------------------    Component Value Date/Time   BNP 328.2 (H) 10/13/2016 1749    Inpatient Medications  Scheduled Meds: . aspirin EC  81 mg Oral Daily  . budesonide (PULMICORT) nebulizer solution  0.5 mg Nebulization BID  . carvedilol  3.125 mg Oral BID WC  . enoxaparin (LOVENOX) injection  40 mg Subcutaneous Q24H  . furosemide  40 mg Intravenous  Daily  . insulin aspart  0-15 Units Subcutaneous TID WC  . insulin aspart  20 Units Subcutaneous BID WC  . insulin detemir  50 Units Subcutaneous BID  . ipratropium-albuterol  3 mL  Nebulization QID  . linaclotide  290 mcg Oral QAC breakfast  . lisinopril  5 mg Oral Daily  . pantoprazole  40 mg Oral Daily  . predniSONE  50 mg Oral Q breakfast  . simvastatin  40 mg Oral Daily  . sodium chloride flush  3 mL Intravenous Q12H  . sucralfate  1 g Oral TID   Continuous Infusions:  PRN Meds:.acetaminophen **OR** acetaminophen, ipratropium-albuterol, lactulose, menthol-cetylpyridinium, ondansetron **OR** ondansetron (ZOFRAN) IV, oxyCODONE-acetaminophen, phenol, polyethylene glycol, simethicone  Micro Results Recent Results (from the past 240 hour(s))  MRSA PCR Screening     Status: None   Collection Time: 10/14/16  1:55 AM  Result Value Ref Range Status   MRSA by PCR NEGATIVE NEGATIVE Final    Comment:        The GeneXpert MRSA Assay (FDA approved for NASAL specimens only), is one component of a comprehensive MRSA colonization surveillance program. It is not intended to diagnose MRSA infection nor to guide or monitor treatment for MRSA infections.     Radiology Reports Dg Abd 1 View  Result Date: 10/14/2016 CLINICAL DATA:  Bilateral abdominal pain EXAM: ABDOMEN - 1 VIEW COMPARISON:  10/03/2016, 10/02/2016 FINDINGS: Nonspecific nonobstructed gas pattern. No pathologic calcifications. IMPRESSION: Nonspecific nonobstructed gas pattern Electronically Signed   By: Donavan Foil M.D.   On: 10/14/2016 01:57   Ct Angio Chest Pe W And/or Wo Contrast  Result Date: 10/13/2016 CLINICAL DATA:  Initial evaluation for increased oxygen demand, hypoxia. EXAM: CT ANGIOGRAPHY CHEST WITH CONTRAST TECHNIQUE: Multidetector CT imaging of the chest was performed using the standard protocol during bolus administration of intravenous contrast. Multiplanar CT image reconstructions and MIPs were obtained to evaluate the vascular anatomy. CONTRAST:  90 cc of Isovue 370. COMPARISON:  Prior CT from 09/20/2016 as well as previous radiograph from earlier same day. FINDINGS: Cardiovascular:  Intrathoracic aorta a grossly normal and of normal caliber. No definite aortic abnormality, although evaluation limited due to timing of the contrast bolus. Cardiomegaly is stable from prior. Right ventricular and right atrial enlargement with straightening of the intraventricular septum. No pericardial effusion. Pulmonary arterial tree adequately opacified for evaluation. Main pulmonary artery mildly dilated to 3.2 cm and transaxial diameter. No filling defect to suggest acute pulmonary embolism identified. Re-formatted imaging confirms these findings. From the data bowing for possible small distal emboli somewhat limited on CT moved respiratory motion artifact and body habitus. Mediastinum/Nodes: Visualized thyroid is normal. Scattered enlarged mediastinal lymph nodes measure up to approximately 2 cm in short access, not significantly changed relative to recent CT. Prevascular node measures 15 mm, stable. Right hilar node measures 14 mm. Soft tissue fullness at the left hilum with 16 mm left hilar node also similar. Mildly prominent left axillary node measures up to 11 mm, similar to previous. No right axillary adenopathy. Esophagus within normal limits. Lungs/Pleura: Small layering bilateral pleural effusions. Diffuse interlobular septal thickening suggestive of pulmonary edema. 5 mm right upper lobe nodule is stable (series 6, image 32). Additional 10 x 9 mm nodular density within the posterior left upper lobe also stable (series 6, image 34). Unchanged 5 mm subpleural left upper lobe nodule (series 6, image 42). No focal infiltrates. Linear atelectasis within the right middle lobe and lingula. No pneumothorax. Upper Abdomen: Visualized upper abdomen within  normal limits. Musculoskeletal: No acute osseous abnormality. No worrisome lytic or blastic osseous lesions. Review of the MIP images confirms the above findings. IMPRESSION: 1. No CT evidence for acute pulmonary embolism. 2. Diffuse interlobular septal  thickening, suggesting mild pulmonary interstitial edema. Small layering bilateral pleural effusions. 3. Dilatation of the main pulmonary artery to 3.2 cm, suggesting pulmonary hypertension. Right ventricular and right atrial dilatation with mild straightening of the intraventricular septum, suggestive of elevated right sided heart pressures. Findings are similar relative to recent CT. 4. Similar appearance of enlarged mediastinal, hilar, and left axillary lymph nodes as above. Again, while these findings may be related to underlying systemic disease such as sarcoidosis, possible lymphoproliferative disorder could also have this appearance. 5. Bilateral pulmonary nodules, stable relative to previous examinations, presumably benign. Electronically Signed   By: Jeannine Boga M.D.   On: 10/13/2016 22:24   Dg Chest Portable 1 View  Result Date: 10/13/2016 CLINICAL DATA:  60 year old female with history of acute on chronic shortness of breath today. Low oxygen saturations. EXAM: PORTABLE CHEST 1 VIEW COMPARISON:  Chest x-ray 09/30/2016. FINDINGS: There is cephalization of the pulmonary vasculature and slight indistinctness of the interstitial markings suggestive of mild pulmonary edema. Mild cardiomegaly trace bilateral pleural effusions. The patient is rotated to the right on today's exam, resulting in distortion of the mediastinal contours and reduced diagnostic sensitivity and specificity for mediastinal pathology. Atherosclerosis in the thoracic aorta. IMPRESSION: 1. The appearance of the chest suggests mild congestive heart failure, as above. Electronically Signed   By: Vinnie Langton M.D.   On: 10/13/2016 17:44    Time Spent in minutes 35   Louellen Molder M.D on 10/17/2016 at 11:14 AM  Between 7am to 7pm - Pager - [phone]  After 7pm go to [email or web address] - password [facility]  [facility] -  Office  [phone]

## 2016-10-17 NOTE — Progress Notes (Signed)
Patient ID: Nancy Castillo, female   DOB: 06/27/1956, 60 y.o.   MRN: [id number]   SUBJECTIVE: No complaints.  Says she is not short of breath at rest.  Out of breath walking.    Scheduled Meds: . aspirin EC  81 mg Oral Daily  . budesonide (PULMICORT) nebulizer solution  0.5 mg Nebulization BID  . carvedilol  3.125 mg Oral BID WC  . enoxaparin (LOVENOX) injection  40 mg Subcutaneous Q24H  . furosemide  40 mg Intravenous Daily  . insulin aspart  0-15 Units Subcutaneous TID WC  . insulin aspart  20 Units Subcutaneous BID WC  . insulin detemir  50 Units Subcutaneous BID  . ipratropium-albuterol  3 mL Nebulization QID  . linaclotide  290 mcg Oral QAC breakfast  . lisinopril  5 mg Oral Daily  . pantoprazole  40 mg Oral Daily  . predniSONE  50 mg Oral Q breakfast  . simvastatin  40 mg Oral Daily  . sodium chloride flush  3 mL Intravenous Q12H  . sucralfate  1 g Oral TID   Continuous Infusions:  PRN Meds:.acetaminophen **OR** acetaminophen, ipratropium-albuterol, lactulose, menthol-cetylpyridinium, ondansetron **OR** ondansetron (ZOFRAN) IV, oxyCODONE-acetaminophen, phenol, polyethylene glycol, simethicone    Vitals:   10/17/16 0552 10/17/16 0835 10/17/16 1137 10/17/16 1247  BP: 112/73  107/69   Pulse: 91  (!) 103   Resp: 20  18   Temp: 98 F (36.7 C)  97 F (36.1 C)   TempSrc: Oral  Oral   SpO2: 91% 93% 91% 91%  Weight: 232 lb 9.6 oz (105.5 kg)     Height:        Intake/Output Summary (Last 24 hours) at 10/17/16 1624 Last data filed at 10/17/16 1300  Gross per 24 hour  Intake              446 ml  Output             3150 ml  Net            -2704 ml    LABS: Basic Metabolic Panel: No results for input(s): NA, K, CL, CO2, GLUCOSE, BUN, CREATININE, CALCIUM, MG, PHOS in the last 72 hours. Liver Function Tests: No results for input(s): AST, ALT, ALKPHOS, BILITOT, PROT, ALBUMIN in the last 72 hours. No results for input(s): LIPASE, AMYLASE in the last 72 hours. CBC: No  results for input(s): WBC, NEUTROABS, HGB, HCT, MCV, PLT in the last 72 hours. Cardiac Enzymes:  Recent Labs  10/14/16 1832  TROPONINI 0.03*   BNP: Invalid input(s): POCBNP D-Dimer: No results for input(s): DDIMER in the last 72 hours. Hemoglobin A1C: No results for input(s): HGBA1C in the last 72 hours. Fasting Lipid Panel: No results for input(s): CHOL, HDL, LDLCALC, TRIG, CHOLHDL, LDLDIRECT in the last 72 hours. Thyroid Function Tests: No results for input(s): TSH, T4TOTAL, T3FREE, THYROIDAB in the last 72 hours.  Invalid input(s): FREET3 Anemia Panel: No results for input(s): VITAMINB12, FOLATE, FERRITIN, TIBC, IRON, RETICCTPCT in the last 72 hours.  RADIOLOGY: Dg Abd 1 View  Result Date: 10/14/2016 CLINICAL DATA:  Bilateral abdominal pain EXAM: ABDOMEN - 1 VIEW COMPARISON:  10/03/2016, 10/02/2016 FINDINGS: Nonspecific nonobstructed gas pattern. No pathologic calcifications. IMPRESSION: Nonspecific nonobstructed gas pattern Electronically Signed   By: Donavan Foil M.D.   On: 10/14/2016 01:57   Ct Angio Chest Pe W And/or Wo Contrast  Result Date: 10/13/2016 CLINICAL DATA:  Initial evaluation for increased oxygen demand, hypoxia. EXAM: CT ANGIOGRAPHY CHEST WITH CONTRAST TECHNIQUE: Multidetector  CT imaging of the chest was performed using the standard protocol during bolus administration of intravenous contrast. Multiplanar CT image reconstructions and MIPs were obtained to evaluate the vascular anatomy. CONTRAST:  90 cc of Isovue 370. COMPARISON:  Prior CT from 09/20/2016 as well as previous radiograph from earlier same day. FINDINGS: Cardiovascular: Intrathoracic aorta a grossly normal and of normal caliber. No definite aortic abnormality, although evaluation limited due to timing of the contrast bolus. Cardiomegaly is stable from prior. Right ventricular and right atrial enlargement with straightening of the intraventricular septum. No pericardial effusion. Pulmonary arterial  tree adequately opacified for evaluation. Main pulmonary artery mildly dilated to 3.2 cm and transaxial diameter. No filling defect to suggest acute pulmonary embolism identified. Re-formatted imaging confirms these findings. From the data bowing for possible small distal emboli somewhat limited on CT moved respiratory motion artifact and body habitus. Mediastinum/Nodes: Visualized thyroid is normal. Scattered enlarged mediastinal lymph nodes measure up to approximately 2 cm in short access, not significantly changed relative to recent CT. Prevascular node measures 15 mm, stable. Right hilar node measures 14 mm. Soft tissue fullness at the left hilum with 16 mm left hilar node also similar. Mildly prominent left axillary node measures up to 11 mm, similar to previous. No right axillary adenopathy. Esophagus within normal limits. Lungs/Pleura: Small layering bilateral pleural effusions. Diffuse interlobular septal thickening suggestive of pulmonary edema. 5 mm right upper lobe nodule is stable (series 6, image 32). Additional 10 x 9 mm nodular density within the posterior left upper lobe also stable (series 6, image 34). Unchanged 5 mm subpleural left upper lobe nodule (series 6, image 42). No focal infiltrates. Linear atelectasis within the right middle lobe and lingula. No pneumothorax. Upper Abdomen: Visualized upper abdomen within normal limits. Musculoskeletal: No acute osseous abnormality. No worrisome lytic or blastic osseous lesions. Review of the MIP images confirms the above findings. IMPRESSION: 1. No CT evidence for acute pulmonary embolism. 2. Diffuse interlobular septal thickening, suggesting mild pulmonary interstitial edema. Small layering bilateral pleural effusions. 3. Dilatation of the main pulmonary artery to 3.2 cm, suggesting pulmonary hypertension. Right ventricular and right atrial dilatation with mild straightening of the intraventricular septum, suggestive of elevated right sided heart  pressures. Findings are similar relative to recent CT. 4. Similar appearance of enlarged mediastinal, hilar, and left axillary lymph nodes as above. Again, while these findings may be related to underlying systemic disease such as sarcoidosis, possible lymphoproliferative disorder could also have this appearance. 5. Bilateral pulmonary nodules, stable relative to previous examinations, presumably benign. Electronically Signed   By: Jeannine Boga M.D.   On: 10/13/2016 22:24   Dg Chest Portable 1 View  Result Date: 10/13/2016 CLINICAL DATA:  60 year old female with history of acute on chronic shortness of breath today. Low oxygen saturations. EXAM: PORTABLE CHEST 1 VIEW COMPARISON:  Chest x-ray 09/30/2016. FINDINGS: There is cephalization of the pulmonary vasculature and slight indistinctness of the interstitial markings suggestive of mild pulmonary edema. Mild cardiomegaly trace bilateral pleural effusions. The patient is rotated to the right on today's exam, resulting in distortion of the mediastinal contours and reduced diagnostic sensitivity and specificity for mediastinal pathology. Atherosclerosis in the thoracic aorta. IMPRESSION: 1. The appearance of the chest suggests mild congestive heart failure, as above. Electronically Signed   By: Vinnie Langton M.D.   On: 10/13/2016 17:44    PHYSICAL EXAM General: NAD Neck: JVP 8-9 cm, no thyromegaly or thyroid nodule.  Lungs: Distant breath sounds CV: Nondisplaced PMI.  Heart  regular S1/S2, no S3/S4, no murmur.  No peripheral edema.   Abdomen: Soft, nontender, no hepatosplenomegaly, no distention.  Neurologic: Alert and oriented x 3.  Psych: Normal affect. Extremities: No clubbing or cyanosis.   TELEMETRY: Reviewed telemetry pt in NSR  ASSESSMENT AND PLAN: 60 yo with history of COPD/chronic hypoxic respiratory failure on 2 L home oxygen, OSA but not using CPAP, CAD s/p RCA PCI, and diastolic CHF/cor pulmonale presented with acute on  chronic diastolic CHF. 1. Acute on chronic diastolic CHF with cor pulmonale: Echo (10/17) with EF 50-55%, flattened interventricular septum, RV mildly dilated and severely decreased systolic function, no PA pressure estimate. CTA chest with no PE but did show some pulmonary edema.  She appears to still have some volume overload on exam.  - Lasix 40 mg IV bid.  Needs BMET now as has not had BMET.  2. Pulmonary hypertension: Suspect significant pulmonary hypertension with RV failure on echo.  She carries a history of COPD, but CT chest is not described as showing emphysema.  She has had no PFTs in our system.   - She needs RHC.  If pulmonary arterial hypertension is noted, will need to try to differentiate group 1 and group 3 PH.  Group 3 PH from COPD would be unlikely to improve with pulmonary vasodilators.  However, if there is a group 1 component, vasodilators may help.  Therefore, will need help from pulmonary service in determining the severity of COPD (?would PH be out of proportion to degree of COPD).  PFTs will be helpful, would like her to be fully diuresed first.  3. Chronic hypoxemic respiratory failure: Has been on home oxygen.  Carries diagnosis of COPD but no PFTs in system and CT chest not described as showing emphysema.  If significant PH is present, will need help from pulmonary service to determine if PH is out of proportion to degree of COPD.  4. OSA: Not on CPAP at home but should be.  Use CPAP in hospital.   [name] 10/17/2016 4:35 PM

## 2016-10-18 DIAGNOSIS — I272 Pulmonary hypertension, unspecified: Secondary | ICD-10-CM

## 2016-10-18 DIAGNOSIS — I509 Heart failure, unspecified: Secondary | ICD-10-CM

## 2016-10-18 LAB — BASIC METABOLIC PANEL
ANION GAP: 11 (ref 5–15)
BUN: 20 mg/dL (ref 6–20)
CHLORIDE: 98 mmol/L — AB (ref 101–111)
CO2: 28 mmol/L (ref 22–32)
Calcium: 9.4 mg/dL (ref 8.9–10.3)
Creatinine, Ser: 1.01 mg/dL — ABNORMAL HIGH (ref 0.44–1.00)
GFR calc Af Amer: >60 mL/min (ref >60)
GLUCOSE: 408 mg/dL — AB (ref 65–99)
POTASSIUM: 4.1 mmol/L (ref 3.5–5.1)
Sodium: 137 mmol/L (ref 135–145)

## 2016-10-18 LAB — GLUCOSE, CAPILLARY
GLUCOSE-CAPILLARY: 92 mg/dL (ref 65–99)
Glucose-Capillary: 303 mg/dL — ABNORMAL HIGH (ref 65–99)
Glucose-Capillary: 305 mg/dL — ABNORMAL HIGH (ref 65–99)
Glucose-Capillary: 335 mg/dL — ABNORMAL HIGH (ref 65–99)
Glucose-Capillary: 341 mg/dL — ABNORMAL HIGH (ref 65–99)
Glucose-Capillary: 364 mg/dL — ABNORMAL HIGH (ref 65–99)

## 2016-10-18 MED ORDER — INSULIN DETEMIR 100 UNIT/ML ~~LOC~~ SOLN
55.0000 [IU] | Freq: Two times a day (BID) | SUBCUTANEOUS | Status: DC
  Administered 2016-10-18 – 2016-10-21 (×7): 55 [IU] via SUBCUTANEOUS
  Filled 2016-10-18 (×8): qty 0.55

## 2016-10-18 MED ORDER — INSULIN ASPART 100 UNIT/ML ~~LOC~~ SOLN
20.0000 [IU] | Freq: Three times a day (TID) | SUBCUTANEOUS | Status: DC
  Administered 2016-10-18: 20 [IU] via SUBCUTANEOUS

## 2016-10-18 NOTE — Progress Notes (Signed)
CPAP set up. Placed pt on via nasal mask, auto settings with 4 Lpm O2 bleed in. Patient stating she does not think she will be able to tolerate but will try to wear it.

## 2016-10-18 NOTE — Progress Notes (Signed)
PROGRESS NOTE                                                                                                                                                                                                             Patient Demographics:    Nancy Castillo, is a 60 y.o. female, DOB - [birth year], ZSW:[id number]  Admit date - 10/13/2016   Admitting Physician Rise Patience, MD  Outpatient Primary MD for the patient is No primary care provider on file.  LOS - 4  Outpatient Specialists: Pulmonologist in [place] Complaint  Patient presents with  . Abdominal Pain       Brief Narrative   60 year old obese female with COPD, chronic hypoxic respiratory failure on 3 L oxygen at home, pulmonary hypertension, CAD presented to the ED with epigastric pain and shortness of breath. She was recently hospitalized at [facility] for COPD exacerbation. CT angiogram of the chest was negative for PE but showed diffuse septal thickening suggestive of mild pulmonary interstitial edema and pulmonary hypertension. Patient admitted for COPD exacerbation and acute on chronic respiratory failure.   Subjective:    maintaining O2 sat 4 L. Breathing better today. Refused CPAP overnight.   Assessment  & Plan :    Principal Problem:   Acute on chronic respiratory failure with hypoxia (HCC)  Continue scheduled DuoNeb's. added Pulmicort nebulizer twice a day, pulmonary toilet and flutter valve.  discontinued steroid.  Titrate oxygen (88-92%).Continuous pulse ox at bedside.   Acute cor pulmonale with severe pulmonary hypertension Echo shows severe pulmonary hypertension and possibly acute right heart failure. appreciate pulm recs. cardiology consulted to evaluate for RHC.  Recommend PFT to help determine severity of COPD. Plan to aggressively diurese first and then decide on RHC. Lasix increased to 40 mg IV twice a day.  (-5.3L negative since admit)  Active Problems:   PUD (peptic ulcer disease) Patient reports having EGD 8 months back showing gastric ulcer.  Continue PPI and GI cocktail. Denies further symptoms.   OSA Recommended CPAP at home but patient has not been able to afford them. Patient instructed to be compliant with CPAP here. CM consult for financial  assistance.    Uncontrolled diabetes mellitus without complication, with long-term current use of insulin (HCC) CBG in 300s-400s.  Increase levemir to 55  units twice  a day. Increase aspart 20 units 3 times a day. Check A1C.     Benign essential HTN Continue home medication  Dyslipidemia Continue simvastatin.      Code Status : full code  Family Communication  : None at bedside  Disposition Plan  : Home once respiratory function improved,  Barriers For Discharge : Active symptoms  Consults  : Cardiology Pulmonary  Procedures  :2-D echo  DVT Prophylaxis  :  Lovenox   Lab Results  Component Value Date   PLT 225 10/14/2016    Antibiotics  :   Anti-infectives    None        Objective:   Vitals:   10/17/16 2113 10/18/16 0558 10/18/16 0808 10/18/16 0842  BP:  120/73    Pulse:  90    Resp:  18    Temp:  98.5 F (36.9 C)    TempSrc:  Oral    SpO2: 97% 93% (!) 88% 93%  Weight:  105.5 kg (232 lb 8 oz)    Height:        Wt Readings from Last 3 Encounters:  10/18/16 105.5 kg (232 lb 8 oz)  03/13/12 100.7 kg (222 lb)     Intake/Output Summary (Last 24 hours) at 10/18/16 0956 Last data filed at 10/18/16 0900  Gross per 24 hour  Intake              683 ml  Output             2700 ml  Net            -2017 ml     Physical Exam  Gen: Obese female not in distress HEENT:  moist mucosa, supple neck Chest: Improved breath sounds bilaterally CVS: N S1&S2, no murmurs,  GI: soft,  ND, BS+, non tender Musculoskeletal: warm, no edema     Data Review:    CBC  Recent Labs Lab 10/13/16 1749  10/14/16 1249  WBC 9.9 9.8  HGB 12.1 11.7*  HCT 40.7 40.8  PLT 254 225  MCV 88.1 91.1  MCH 26.2 26.1  MCHC 29.7* 28.7*  RDW 18.3* 18.6*  LYMPHSABS  --  1.8  MONOABS  --  0.4  EOSABS  --  0.1  BASOSABS  --  0.0    Chemistries   Recent Labs Lab 10/13/16 1749 10/14/16 1249 10/17/16 1621 10/18/16 0444  NA 139 142 134* 137  K 4.1 3.8 4.6 4.1  CL 105 104 96* 98*  CO2 '24 29 28 28  '$ GLUCOSE 213* 170* 420* 408*  BUN 7 9 22* 20  CREATININE 0.76 0.81 1.01* 1.01*  CALCIUM 9.1 9.3 9.7 9.4  AST 23 15  --   --   ALT 23 20  --   --   ALKPHOS 72 64  --   --   BILITOT 0.7 0.7  --   --    ------------------------------------------------------------------------------------------------------------------ No results for input(s): CHOL, HDL, LDLCALC, TRIG, CHOLHDL, LDLDIRECT in the last 72 hours.  No results found for: HGBA1C ------------------------------------------------------------------------------------------------------------------ No results for input(s): TSH, T4TOTAL, T3FREE, THYROIDAB in the last 72 hours.  Invalid input(s): FREET3 ------------------------------------------------------------------------------------------------------------------ No results for input(s): VITAMINB12, FOLATE, FERRITIN, TIBC, IRON, RETICCTPCT in the last 72 hours.  Coagulation profile No results for input(s): INR, PROTIME in the last 168 hours.  No results for input(s): DDIMER in the last 72 hours.  Cardiac Enzymes  Recent Labs Lab 10/14/16 0250 10/14/16 1249 10/14/16 1832  TROPONINI <0.03 0.03*  <0.03  0.03*   ------------------------------------------------------------------------------------------------------------------    Component Value Date/Time   BNP 328.2 (H) 10/13/2016 1749    Inpatient Medications  Scheduled Meds: . aspirin EC  81 mg Oral Daily  . budesonide (PULMICORT) nebulizer solution  0.5 mg Nebulization BID  . carvedilol  3.125 mg Oral BID WC  . enoxaparin  (LOVENOX) injection  40 mg Subcutaneous Q24H  . furosemide  40 mg Intravenous BID  . insulin aspart  0-15 Units Subcutaneous TID WC  . insulin aspart  20 Units Subcutaneous BID WC  . insulin detemir  50 Units Subcutaneous BID  . ipratropium-albuterol  3 mL Nebulization QID  . linaclotide  290 mcg Oral QAC breakfast  . lisinopril  5 mg Oral Daily  . pantoprazole  40 mg Oral Daily  . potassium chloride  40 mEq Oral Daily  . simvastatin  40 mg Oral Daily  . sodium chloride flush  3 mL Intravenous Q12H  . sucralfate  1 g Oral TID   Continuous Infusions:  PRN Meds:.acetaminophen **OR** acetaminophen, ipratropium-albuterol, lactulose, menthol-cetylpyridinium, ondansetron **OR** ondansetron (ZOFRAN) IV, oxyCODONE-acetaminophen, phenol, polyethylene glycol, simethicone  Micro Results Recent Results (from the past 240 hour(s))  MRSA PCR Screening     Status: None   Collection Time: 10/14/16  1:55 AM  Result Value Ref Range Status   MRSA by PCR NEGATIVE NEGATIVE Final    Comment:        The GeneXpert MRSA Assay (FDA approved for NASAL specimens only), is one component of a comprehensive MRSA colonization surveillance program. It is not intended to diagnose MRSA infection nor to guide or monitor treatment for MRSA infections.     Radiology Reports Dg Abd 1 View  Result Date: 10/14/2016 CLINICAL DATA:  Bilateral abdominal pain EXAM: ABDOMEN - 1 VIEW COMPARISON:  10/03/2016, 10/02/2016 FINDINGS: Nonspecific nonobstructed gas pattern. No pathologic calcifications. IMPRESSION: Nonspecific nonobstructed gas pattern Electronically Signed   By: Donavan Foil M.D.   On: 10/14/2016 01:57   Ct Angio Chest Pe W And/or Wo Contrast  Result Date: 10/13/2016 CLINICAL DATA:  Initial evaluation for increased oxygen demand, hypoxia. EXAM: CT ANGIOGRAPHY CHEST WITH CONTRAST TECHNIQUE: Multidetector CT imaging of the chest was performed using the standard protocol during bolus administration of  intravenous contrast. Multiplanar CT image reconstructions and MIPs were obtained to evaluate the vascular anatomy. CONTRAST:  90 cc of Isovue 370. COMPARISON:  Prior CT from 09/20/2016 as well as previous radiograph from earlier same day. FINDINGS: Cardiovascular: Intrathoracic aorta a grossly normal and of normal caliber. No definite aortic abnormality, although evaluation limited due to timing of the contrast bolus. Cardiomegaly is stable from prior. Right ventricular and right atrial enlargement with straightening of the intraventricular septum. No pericardial effusion. Pulmonary arterial tree adequately opacified for evaluation. Main pulmonary artery mildly dilated to 3.2 cm and transaxial diameter. No filling defect to suggest acute pulmonary embolism identified. Re-formatted imaging confirms these findings. From the data bowing for possible small distal emboli somewhat limited on CT moved respiratory motion artifact and body habitus. Mediastinum/Nodes: Visualized thyroid is normal. Scattered enlarged mediastinal lymph nodes measure up to approximately 2 cm in short access, not significantly changed relative to recent CT. Prevascular node measures 15 mm, stable. Right hilar node measures 14 mm. Soft tissue fullness at the left hilum with 16 mm left hilar node also similar. Mildly prominent left axillary node measures up to 11 mm, similar to previous. No right axillary adenopathy. Esophagus within normal limits. Lungs/Pleura: Small layering bilateral pleural effusions. Diffuse  interlobular septal thickening suggestive of pulmonary edema. 5 mm right upper lobe nodule is stable (series 6, image 32). Additional 10 x 9 mm nodular density within the posterior left upper lobe also stable (series 6, image 34). Unchanged 5 mm subpleural left upper lobe nodule (series 6, image 42). No focal infiltrates. Linear atelectasis within the right middle lobe and lingula. No pneumothorax. Upper Abdomen: Visualized upper abdomen  within normal limits. Musculoskeletal: No acute osseous abnormality. No worrisome lytic or blastic osseous lesions. Review of the MIP images confirms the above findings. IMPRESSION: 1. No CT evidence for acute pulmonary embolism. 2. Diffuse interlobular septal thickening, suggesting mild pulmonary interstitial edema. Small layering bilateral pleural effusions. 3. Dilatation of the main pulmonary artery to 3.2 cm, suggesting pulmonary hypertension. Right ventricular and right atrial dilatation with mild straightening of the intraventricular septum, suggestive of elevated right sided heart pressures. Findings are similar relative to recent CT. 4. Similar appearance of enlarged mediastinal, hilar, and left axillary lymph nodes as above. Again, while these findings may be related to underlying systemic disease such as sarcoidosis, possible lymphoproliferative disorder could also have this appearance. 5. Bilateral pulmonary nodules, stable relative to previous examinations, presumably benign. Electronically Signed   By: Jeannine Boga M.D.   On: 10/13/2016 22:24   Dg Chest Portable 1 View  Result Date: 10/13/2016 CLINICAL DATA:  60 year old female with history of acute on chronic shortness of breath today. Low oxygen saturations. EXAM: PORTABLE CHEST 1 VIEW COMPARISON:  Chest x-ray 09/30/2016. FINDINGS: There is cephalization of the pulmonary vasculature and slight indistinctness of the interstitial markings suggestive of mild pulmonary edema. Mild cardiomegaly trace bilateral pleural effusions. The patient is rotated to the right on today's exam, resulting in distortion of the mediastinal contours and reduced diagnostic sensitivity and specificity for mediastinal pathology. Atherosclerosis in the thoracic aorta. IMPRESSION: 1. The appearance of the chest suggests mild congestive heart failure, as above. Electronically Signed   By: Vinnie Langton M.D.   On: 10/13/2016 17:44    Time Spent in minutes  35   Louellen Molder M.D on 10/18/2016 at 9:56 AM  Between 7am to 7pm - Pager - [phone]  After 7pm go to [email or web address] - password [facility]  [facility] -  Office  [phone]

## 2016-10-18 NOTE — Progress Notes (Signed)
Patient ID: Nancy Castillo, female   DOB: [birth year], 58 y.o.      SUBJECTIVE: Good diuresis yesterday, starting to breathe better.     Scheduled Meds: . aspirin EC  81 mg Oral Daily  . budesonide (PULMICORT) nebulizer solution  0.5 mg Nebulization BID  . carvedilol  3.125 mg Oral BID WC  . enoxaparin (LOVENOX) injection  40 mg Subcutaneous Q24H  . furosemide  40 mg Intravenous BID  . insulin aspart  0-15 Units Subcutaneous TID WC  . insulin aspart  20 Units Subcutaneous TID WC  . insulin detemir  55 Units Subcutaneous BID  . ipratropium-albuterol  3 mL Nebulization QID  . linaclotide  290 mcg Oral QAC breakfast  . lisinopril  5 mg Oral Daily  . pantoprazole  40 mg Oral Daily  . potassium chloride  40 mEq Oral Daily  . simvastatin  40 mg Oral Daily  . sodium chloride flush  3 mL Intravenous Q12H  . sucralfate  1 g Oral TID   Continuous Infusions:  PRN Meds:.acetaminophen **OR** acetaminophen, ipratropium-albuterol, lactulose, menthol-cetylpyridinium, ondansetron **OR** ondansetron (ZOFRAN) IV, oxyCODONE-acetaminophen, phenol, polyethylene glycol, simethicone    Vitals:   10/18/16 0808 10/18/16 0842 10/18/16 1031 10/18/16 1106  BP:   98/72   Pulse:      Resp:      Temp:      TempSrc:      SpO2: (!) 88% 93% 93% 91%  Weight:      Height:        Intake/Output Summary (Last 24 hours) at 10/18/16 1121 Last data filed at 10/18/16 1103  Gross per 24 hour  Intake              683 ml  Output             2700 ml  Net            -2017 ml    LABS: Basic Metabolic Panel:  Recent Labs  10/17/16 1621 10/18/16 0444  NA 134* 137  K 4.6 4.1  CL 96* 98*  CO2 28 28  GLUCOSE 420* 408*  BUN 22* 20  CREATININE 1.01* 1.01*  CALCIUM 9.7 9.4   Liver Function Tests: No results for input(s): AST, ALT, ALKPHOS, BILITOT, PROT, ALBUMIN in the last 72 hours. No results for input(s): LIPASE, AMYLASE in the last 72 hours. CBC: No results for input(s): WBC, NEUTROABS, HGB, HCT, MCV,  PLT in the last 72 hours. Cardiac Enzymes: No results for input(s): CKTOTAL, CKMB, CKMBINDEX, TROPONINI in the last 72 hours. BNP: Invalid input(s): POCBNP D-Dimer: No results for input(s): DDIMER in the last 72 hours. Hemoglobin A1C: No results for input(s): HGBA1C in the last 72 hours. Fasting Lipid Panel: No results for input(s): CHOL, HDL, LDLCALC, TRIG, CHOLHDL, LDLDIRECT in the last 72 hours. Thyroid Function Tests: No results for input(s): TSH, T4TOTAL, T3FREE, THYROIDAB in the last 72 hours.  Invalid input(s): FREET3 Anemia Panel: No results for input(s): VITAMINB12, FOLATE, FERRITIN, TIBC, IRON, RETICCTPCT in the last 72 hours.  RADIOLOGY: Dg Abd 1 View  Result Date: 10/14/2016 CLINICAL DATA:  Bilateral abdominal pain EXAM: ABDOMEN - 1 VIEW COMPARISON:  10/03/2016, 10/02/2016 FINDINGS: Nonspecific nonobstructed gas pattern. No pathologic calcifications. IMPRESSION: Nonspecific nonobstructed gas pattern Electronically Signed   By: Donavan Foil M.D.   On: 10/14/2016 01:57   Ct Angio Chest Pe W And/or Wo Contrast  Result Date: 10/13/2016 CLINICAL DATA:  Initial evaluation for increased oxygen demand, hypoxia. EXAM: CT ANGIOGRAPHY CHEST  WITH CONTRAST TECHNIQUE: Multidetector CT imaging of the chest was performed using the standard protocol during bolus administration of intravenous contrast. Multiplanar CT image reconstructions and MIPs were obtained to evaluate the vascular anatomy. CONTRAST:  90 cc of Isovue 370. COMPARISON:  Prior CT from 09/20/2016 as well as previous radiograph from earlier same day. FINDINGS: Cardiovascular: Intrathoracic aorta a grossly normal and of normal caliber. No definite aortic abnormality, although evaluation limited due to timing of the contrast bolus. Cardiomegaly is stable from prior. Right ventricular and right atrial enlargement with straightening of the intraventricular septum. No pericardial effusion. Pulmonary arterial tree adequately opacified  for evaluation. Main pulmonary artery mildly dilated to 3.2 cm and transaxial diameter. No filling defect to suggest acute pulmonary embolism identified. Re-formatted imaging confirms these findings. From the data bowing for possible small distal emboli somewhat limited on CT moved respiratory motion artifact and body habitus. Mediastinum/Nodes: Visualized thyroid is normal. Scattered enlarged mediastinal lymph nodes measure up to approximately 2 cm in short access, not significantly changed relative to recent CT. Prevascular node measures 15 mm, stable. Right hilar node measures 14 mm. Soft tissue fullness at the left hilum with 16 mm left hilar node also similar. Mildly prominent left axillary node measures up to 11 mm, similar to previous. No right axillary adenopathy. Esophagus within normal limits. Lungs/Pleura: Small layering bilateral pleural effusions. Diffuse interlobular septal thickening suggestive of pulmonary edema. 5 mm right upper lobe nodule is stable (series 6, image 32). Additional 10 x 9 mm nodular density within the posterior left upper lobe also stable (series 6, image 34). Unchanged 5 mm subpleural left upper lobe nodule (series 6, image 42). No focal infiltrates. Linear atelectasis within the right middle lobe and lingula. No pneumothorax. Upper Abdomen: Visualized upper abdomen within normal limits. Musculoskeletal: No acute osseous abnormality. No worrisome lytic or blastic osseous lesions. Review of the MIP images confirms the above findings. IMPRESSION: 1. No CT evidence for acute pulmonary embolism. 2. Diffuse interlobular septal thickening, suggesting mild pulmonary interstitial edema. Small layering bilateral pleural effusions. 3. Dilatation of the main pulmonary artery to 3.2 cm, suggesting pulmonary hypertension. Right ventricular and right atrial dilatation with mild straightening of the intraventricular septum, suggestive of elevated right sided heart pressures. Findings are similar  relative to recent CT. 4. Similar appearance of enlarged mediastinal, hilar, and left axillary lymph nodes as above. Again, while these findings may be related to underlying systemic disease such as sarcoidosis, possible lymphoproliferative disorder could also have this appearance. 5. Bilateral pulmonary nodules, stable relative to previous examinations, presumably benign. Electronically Signed   By: Jeannine Boga M.D.   On: 10/13/2016 22:24   Dg Chest Portable 1 View  Result Date: 10/13/2016 CLINICAL DATA:  60 year old female with history of acute on chronic shortness of breath today. Low oxygen saturations. EXAM: PORTABLE CHEST 1 VIEW COMPARISON:  Chest x-ray 09/30/2016. FINDINGS: There is cephalization of the pulmonary vasculature and slight indistinctness of the interstitial markings suggestive of mild pulmonary edema. Mild cardiomegaly trace bilateral pleural effusions. The patient is rotated to the right on today's exam, resulting in distortion of the mediastinal contours and reduced diagnostic sensitivity and specificity for mediastinal pathology. Atherosclerosis in the thoracic aorta. IMPRESSION: 1. The appearance of the chest suggests mild congestive heart failure, as above. Electronically Signed   By: Vinnie Langton M.D.   On: 10/13/2016 17:44    PHYSICAL EXAM General: NAD Neck: Thick, difficult to assess JVP think it remains around 8-9 cm, no thyromegaly or  thyroid nodule.  Lungs: Distant breath sounds CV: Nondisplaced PMI.  Heart regular S1/S2, no S3/S4, no murmur.  No peripheral edema.   Abdomen: Soft, nontender, no hepatosplenomegaly, no distention.  Neurologic: Alert and oriented x 3.  Psych: Normal affect. Extremities: No clubbing or cyanosis.   TELEMETRY: Reviewed telemetry pt in NSR  ASSESSMENT AND PLAN: 60 yo with history of COPD/chronic hypoxic respiratory failure on 2 L home oxygen, OSA but not using CPAP, CAD s/p RCA PCI, and diastolic CHF/cor pulmonale presented  with acute on chronic diastolic CHF. 1. Acute on chronic diastolic CHF with cor pulmonale: Echo (10/17) with EF 50-55%, flattened interventricular septum, RV mildly dilated and severely decreased systolic function, no PA pressure estimate. CTA chest with no PE but did show some pulmonary edema.  She appears to still have some volume overload on exam.  - Lasix 40 mg IV bid today, probably to po tomorrow.   2. Pulmonary hypertension: Suspect significant pulmonary hypertension with RV failure on echo.  She carries a history of COPD, but CT chest is not described as showing emphysema.  She has had no PFTs in our system.   - She needs RHC, plan for tomorrow.  If pulmonary arterial hypertension is noted, will need to try to differentiate group 1 and group 3 PH.  Group 3 PH from COPD would be unlikely to improve with pulmonary vasodilators.  However, if there is a group 1 component, vasodilators may help.  Therefore, will need help from pulmonary service in determining the severity of COPD (?would PH be out of proportion to degree of COPD).  PFTs will be helpful, would like her to be fully diuresed first.  3. Chronic hypoxemic respiratory failure: Has been on home oxygen.  Carries diagnosis of COPD but no PFTs in system and CT chest not described as showing emphysema.  If significant PH is present, will need help from pulmonary service to determine if PH is out of proportion to degree of COPD.  4. OSA: Not on CPAP at home but should be.  Use CPAP in hospital.   [name] 10/18/2016 11:21 AM

## 2016-10-19 ENCOUNTER — Encounter (HOSPITAL_COMMUNITY)
Admission: EM | Disposition: A | Discharge status: Home / Self Care | Payer: Self-pay | Source: Home / Self Care | Attending: Internal Medicine

## 2016-10-19 ENCOUNTER — Encounter (HOSPITAL_COMMUNITY): Payer: Self-pay | Admitting: Cardiology

## 2016-10-19 DIAGNOSIS — I509 Heart failure, unspecified: Secondary | ICD-10-CM

## 2016-10-19 DIAGNOSIS — I2609 Other pulmonary embolism with acute cor pulmonale: Secondary | ICD-10-CM

## 2016-10-19 DIAGNOSIS — I272 Pulmonary hypertension, unspecified: Secondary | ICD-10-CM

## 2016-10-19 HISTORY — PX: CARDIAC CATHETERIZATION: SHX172

## 2016-10-19 LAB — PULMONARY FUNCTION TEST
DL/VA % PRED: 26 %
DL/VA: 1.38 ml/min/mmHg/L
DLCO unc % pred: 19 %
DLCO unc: 5.41 ml/min/mmHg
FEF 25-75 POST: 0.33 L/s
FEF 25-75 Pre: 0.45 L/sec
FEF2575-%CHANGE-POST: -27 %
FEF2575-%PRED-POST: 13 %
FEF2575-%PRED-PRE: 18 %
FEV1-%CHANGE-POST: -46 %
FEV1-%Pred-Post: 26 %
FEV1-%Pred-Pre: 49 %
FEV1-PRE: 1.19 L
FEV1-Post: 0.63 L
FEV1FVC-%CHANGE-POST: -46 %
FEV1FVC-%PRED-PRE: 58 %
FEV6-%Change-Post: -9 %
FEV6-%Pred-Post: 71 %
FEV6-%Pred-Pre: 78 %
FEV6-Post: 2.1 L
FEV6-Pre: 2.33 L
FEV6FVC-%Change-Post: -11 %
FEV6FVC-%Pred-Post: 86 %
FEV6FVC-%Pred-Pre: 98 %
FVC-%CHANGE-POST: -1 %
FVC-%PRED-POST: 81 %
FVC-%PRED-PRE: 82 %
FVC-POST: 2.49 L
FVC-PRE: 2.53 L
PRE FEV1/FVC RATIO: 47 %
PRE FEV6/FVC RATIO: 96 %
Post FEV1/FVC ratio: 25 %
Post FEV6/FVC ratio: 84 %
RV % pred: 57 %
RV: 1.2 L
TLC % pred: 66 %
TLC: 3.63 L

## 2016-10-19 LAB — CBC
HCT: 45.6 % (ref 36.0–46.0)
Hemoglobin: 13.6 g/dL (ref 12.0–15.0)
MCH: 26.3 pg (ref 26.0–34.0)
MCHC: 29.8 g/dL — AB (ref 30.0–36.0)
MCV: 88 fL (ref 78.0–100.0)
PLATELETS: 281 10*3/uL (ref 150–400)
RBC: 5.18 MIL/uL — ABNORMAL HIGH (ref 3.87–5.11)
RDW: 17.4 % — AB (ref 11.5–15.5)
WBC: 8.9 10*3/uL (ref 4.0–10.5)

## 2016-10-19 LAB — POCT I-STAT 3, VENOUS BLOOD GAS (G3P V)
ACID-BASE EXCESS: 4 mmol/L — AB (ref 0.0–2.0)
Bicarbonate: 32.5 mmol/L — ABNORMAL HIGH (ref 20.0–28.0)
O2 SAT: 52 %
PCO2 VEN: 62.7 mmHg — AB (ref 44.0–60.0)
TCO2: 34 mmol/L (ref 0–100)
pH, Ven: 7.323 (ref 7.250–7.430)
pO2, Ven: 31 mmHg — CL (ref 32.0–45.0)

## 2016-10-19 LAB — BASIC METABOLIC PANEL
Anion gap: 9 (ref 5–15)
BUN: 23 mg/dL — AB (ref 6–20)
CHLORIDE: 100 mmol/L — AB (ref 101–111)
CO2: 29 mmol/L (ref 22–32)
CREATININE: 0.8 mg/dL (ref 0.44–1.00)
Calcium: 9.7 mg/dL (ref 8.9–10.3)
GFR calc Af Amer: >60 mL/min (ref >60)
GFR calc non Af Amer: >60 mL/min (ref >60)
GLUCOSE: 338 mg/dL — AB (ref 65–99)
Potassium: 4.6 mmol/L (ref 3.5–5.1)
Sodium: 138 mmol/L (ref 135–145)

## 2016-10-19 LAB — GLUCOSE, CAPILLARY
GLUCOSE-CAPILLARY: 273 mg/dL — AB (ref 65–99)
GLUCOSE-CAPILLARY: 318 mg/dL — AB (ref 65–99)
Glucose-Capillary: 317 mg/dL — ABNORMAL HIGH (ref 65–99)
Glucose-Capillary: 351 mg/dL — ABNORMAL HIGH (ref 65–99)

## 2016-10-19 SURGERY — RIGHT HEART CATH
Anesthesia: LOCAL

## 2016-10-19 MED ORDER — HEPARIN (PORCINE) IN NACL 2-0.9 UNIT/ML-% IJ SOLN
INTRAMUSCULAR | Status: DC | PRN
  Administered 2016-10-19: 500 mL

## 2016-10-19 MED ORDER — INSULIN ASPART 100 UNIT/ML ~~LOC~~ SOLN
20.0000 [IU] | Freq: Three times a day (TID) | SUBCUTANEOUS | Status: DC
  Administered 2016-10-19 – 2016-10-20 (×2): 20 [IU] via SUBCUTANEOUS

## 2016-10-19 MED ORDER — LIDOCAINE HCL (PF) 1 % IJ SOLN
INTRAMUSCULAR | Status: AC
  Filled 2016-10-19: qty 30

## 2016-10-19 MED ORDER — ENOXAPARIN SODIUM 40 MG/0.4ML ~~LOC~~ SOLN
40.0000 mg | SUBCUTANEOUS | Status: DC
  Administered 2016-10-20 – 2016-10-22 (×3): 40 mg via SUBCUTANEOUS
  Filled 2016-10-19 (×3): qty 0.4

## 2016-10-19 MED ORDER — FENTANYL CITRATE (PF) 100 MCG/2ML IJ SOLN
INTRAMUSCULAR | Status: AC
  Filled 2016-10-19: qty 2

## 2016-10-19 MED ORDER — SODIUM CHLORIDE 0.9% FLUSH: 3.0000 mL | INTRAVENOUS | Status: DC | PRN

## 2016-10-19 MED ORDER — LIDOCAINE HCL (PF) 1 % IJ SOLN
INTRAMUSCULAR | Status: DC | PRN
  Administered 2016-10-19: 2 mL

## 2016-10-19 MED ORDER — MIDAZOLAM HCL 2 MG/2ML IJ SOLN
INTRAMUSCULAR | Status: AC
  Filled 2016-10-19: qty 2

## 2016-10-19 MED ORDER — HEPARIN (PORCINE) IN NACL 2-0.9 UNIT/ML-% IJ SOLN
INTRAMUSCULAR | Status: AC
  Filled 2016-10-19: qty 1000

## 2016-10-19 MED ORDER — SODIUM CHLORIDE 0.9% FLUSH
3.0000 mL | Freq: Two times a day (BID) | INTRAVENOUS | Status: DC
  Administered 2016-10-19 – 2016-10-20 (×4): 3 mL via INTRAVENOUS

## 2016-10-19 MED ORDER — SODIUM CHLORIDE 0.9 % IV SOLN: 250.0000 mL | INTRAVENOUS | Status: DC | PRN

## 2016-10-19 MED ORDER — ONDANSETRON HCL 4 MG/2ML IJ SOLN: 4.0000 mg | Freq: Four times a day (QID) | INTRAMUSCULAR | Status: DC | PRN

## 2016-10-19 MED ORDER — SODIUM CHLORIDE 0.9 % IV SOLN: INTRAVENOUS | Status: DC

## 2016-10-19 MED ORDER — SODIUM CHLORIDE 0.9% FLUSH
3.0000 mL | Freq: Two times a day (BID) | INTRAVENOUS | Status: DC
  Administered 2016-10-19: 3 mL via INTRAVENOUS

## 2016-10-19 MED ORDER — SODIUM CHLORIDE 0.9% FLUSH
3.0000 mL | Freq: Two times a day (BID) | INTRAVENOUS | Status: DC
  Administered 2016-10-19 – 2016-10-20 (×3): 3 mL via INTRAVENOUS

## 2016-10-19 MED ORDER — MIDAZOLAM HCL 2 MG/2ML IJ SOLN
INTRAMUSCULAR | Status: DC | PRN
  Administered 2016-10-19: 1 mg via INTRAVENOUS

## 2016-10-19 MED ORDER — ACETAMINOPHEN 325 MG PO TABS
650.0000 mg | ORAL_TABLET | ORAL | Status: DC | PRN
  Filled 2016-10-19 (×2): qty 2

## 2016-10-19 MED ORDER — FUROSEMIDE 10 MG/ML IJ SOLN
40.0000 mg | Freq: Three times a day (TID) | INTRAMUSCULAR | Status: DC
  Administered 2016-10-19 – 2016-10-20 (×3): 40 mg via INTRAVENOUS
  Filled 2016-10-19 (×3): qty 4

## 2016-10-19 MED ORDER — FENTANYL CITRATE (PF) 100 MCG/2ML IJ SOLN
INTRAMUSCULAR | Status: DC | PRN
  Administered 2016-10-19: 25 ug via INTRAVENOUS

## 2016-10-19 SURGICAL SUPPLY — 6 items
CATH BALLN WEDGE 5F 110CM (CATHETERS) ×2 IMPLANT
PACK CARDIAC CATHETERIZATION (CUSTOM PROCEDURE TRAY) ×2 IMPLANT
SHEATH FAST CATH BRACH 5F 5CM (SHEATH) ×2 IMPLANT
TRANSDUCER W/STOPCOCK (MISCELLANEOUS) ×2 IMPLANT
TUBING ART PRESS 72  MALE/FEM (TUBING) ×1
TUBING ART PRESS 72 MALE/FEM (TUBING) ×1 IMPLANT

## 2016-10-19 NOTE — H&P (View-Only) (Signed)
Patient ID: Nancy Castillo, female   DOB: 1956/11/22, 60 y.o.      SUBJECTIVE: Good diuresis yesterday, starting to breathe better.     Scheduled Meds: . aspirin EC  81 mg Oral Daily  . budesonide (PULMICORT) nebulizer solution  0.5 mg Nebulization BID  . carvedilol  3.125 mg Oral BID WC  . enoxaparin (LOVENOX) injection  40 mg Subcutaneous Q24H  . furosemide  40 mg Intravenous BID  . insulin aspart  0-15 Units Subcutaneous TID WC  . insulin aspart  20 Units Subcutaneous TID WC  . insulin detemir  55 Units Subcutaneous BID  . ipratropium-albuterol  3 mL Nebulization QID  . linaclotide  290 mcg Oral QAC breakfast  . lisinopril  5 mg Oral Daily  . pantoprazole  40 mg Oral Daily  . potassium chloride  40 mEq Oral Daily  . simvastatin  40 mg Oral Daily  . sodium chloride flush  3 mL Intravenous Q12H  . sucralfate  1 g Oral TID   Continuous Infusions:  PRN Meds:.acetaminophen **OR** acetaminophen, ipratropium-albuterol, lactulose, menthol-cetylpyridinium, ondansetron **OR** ondansetron (ZOFRAN) IV, oxyCODONE-acetaminophen, phenol, polyethylene glycol, simethicone    Vitals:   10/18/16 0808 10/18/16 0842 10/18/16 1031 10/18/16 1106  BP:   98/72   Pulse:      Resp:      Temp:      TempSrc:      SpO2: (!) 88% 93% 93% 91%  Weight:      Height:        Intake/Output Summary (Last 24 hours) at 10/18/16 1121 Last data filed at 10/18/16 1103  Gross per 24 hour  Intake              683 ml  Output             2700 ml  Net            -2017 ml    LABS: Basic Metabolic Panel:  Recent Labs  10/17/16 1621 10/18/16 0444  NA 134* 137  K 4.6 4.1  CL 96* 98*  CO2 28 28  GLUCOSE 420* 408*  BUN 22* 20  CREATININE 1.01* 1.01*  CALCIUM 9.7 9.4   Liver Function Tests: No results for input(s): AST, ALT, ALKPHOS, BILITOT, PROT, ALBUMIN in the last 72 hours. No results for input(s): LIPASE, AMYLASE in the last 72 hours. CBC: No results for input(s): WBC, NEUTROABS, HGB, HCT, MCV,  PLT in the last 72 hours. Cardiac Enzymes: No results for input(s): CKTOTAL, CKMB, CKMBINDEX, TROPONINI in the last 72 hours. BNP: Invalid input(s): POCBNP D-Dimer: No results for input(s): DDIMER in the last 72 hours. Hemoglobin A1C: No results for input(s): HGBA1C in the last 72 hours. Fasting Lipid Panel: No results for input(s): CHOL, HDL, LDLCALC, TRIG, CHOLHDL, LDLDIRECT in the last 72 hours. Thyroid Function Tests: No results for input(s): TSH, T4TOTAL, T3FREE, THYROIDAB in the last 72 hours.  Invalid input(s): FREET3 Anemia Panel: No results for input(s): VITAMINB12, FOLATE, FERRITIN, TIBC, IRON, RETICCTPCT in the last 72 hours.  RADIOLOGY: Dg Abd 1 View  Result Date: 10/14/2016 CLINICAL DATA:  Bilateral abdominal pain EXAM: ABDOMEN - 1 VIEW COMPARISON:  10/03/2016, 10/02/2016 FINDINGS: Nonspecific nonobstructed gas pattern. No pathologic calcifications. IMPRESSION: Nonspecific nonobstructed gas pattern Electronically Signed   By: Donavan Foil M.D.   On: 10/14/2016 01:57   Ct Angio Chest Pe W And/or Wo Contrast  Result Date: 10/13/2016 CLINICAL DATA:  Initial evaluation for increased oxygen demand, hypoxia. EXAM: CT ANGIOGRAPHY CHEST  WITH CONTRAST TECHNIQUE: Multidetector CT imaging of the chest was performed using the standard protocol during bolus administration of intravenous contrast. Multiplanar CT image reconstructions and MIPs were obtained to evaluate the vascular anatomy. CONTRAST:  90 cc of Isovue 370. COMPARISON:  Prior CT from 09/20/2016 as well as previous radiograph from earlier same day. FINDINGS: Cardiovascular: Intrathoracic aorta a grossly normal and of normal caliber. No definite aortic abnormality, although evaluation limited due to timing of the contrast bolus. Cardiomegaly is stable from prior. Right ventricular and right atrial enlargement with straightening of the intraventricular septum. No pericardial effusion. Pulmonary arterial tree adequately opacified  for evaluation. Main pulmonary artery mildly dilated to 3.2 cm and transaxial diameter. No filling defect to suggest acute pulmonary embolism identified. Re-formatted imaging confirms these findings. From the data bowing for possible small distal emboli somewhat limited on CT moved respiratory motion artifact and body habitus. Mediastinum/Nodes: Visualized thyroid is normal. Scattered enlarged mediastinal lymph nodes measure up to approximately 2 cm in short access, not significantly changed relative to recent CT. Prevascular node measures 15 mm, stable. Right hilar node measures 14 mm. Soft tissue fullness at the left hilum with 16 mm left hilar node also similar. Mildly prominent left axillary node measures up to 11 mm, similar to previous. No right axillary adenopathy. Esophagus within normal limits. Lungs/Pleura: Small layering bilateral pleural effusions. Diffuse interlobular septal thickening suggestive of pulmonary edema. 5 mm right upper lobe nodule is stable (series 6, image 32). Additional 10 x 9 mm nodular density within the posterior left upper lobe also stable (series 6, image 34). Unchanged 5 mm subpleural left upper lobe nodule (series 6, image 42). No focal infiltrates. Linear atelectasis within the right middle lobe and lingula. No pneumothorax. Upper Abdomen: Visualized upper abdomen within normal limits. Musculoskeletal: No acute osseous abnormality. No worrisome lytic or blastic osseous lesions. Review of the MIP images confirms the above findings. IMPRESSION: 1. No CT evidence for acute pulmonary embolism. 2. Diffuse interlobular septal thickening, suggesting mild pulmonary interstitial edema. Small layering bilateral pleural effusions. 3. Dilatation of the main pulmonary artery to 3.2 cm, suggesting pulmonary hypertension. Right ventricular and right atrial dilatation with mild straightening of the intraventricular septum, suggestive of elevated right sided heart pressures. Findings are similar  relative to recent CT. 4. Similar appearance of enlarged mediastinal, hilar, and left axillary lymph nodes as above. Again, while these findings may be related to underlying systemic disease such as sarcoidosis, possible lymphoproliferative disorder could also have this appearance. 5. Bilateral pulmonary nodules, stable relative to previous examinations, presumably benign. Electronically Signed   By: Jeannine Boga M.D.   On: 10/13/2016 22:24   Dg Chest Portable 1 View  Result Date: 10/13/2016 CLINICAL DATA:  60 year old female with history of acute on chronic shortness of breath today. Low oxygen saturations. EXAM: PORTABLE CHEST 1 VIEW COMPARISON:  Chest x-ray 09/30/2016. FINDINGS: There is cephalization of the pulmonary vasculature and slight indistinctness of the interstitial markings suggestive of mild pulmonary edema. Mild cardiomegaly trace bilateral pleural effusions. The patient is rotated to the right on today's exam, resulting in distortion of the mediastinal contours and reduced diagnostic sensitivity and specificity for mediastinal pathology. Atherosclerosis in the thoracic aorta. IMPRESSION: 1. The appearance of the chest suggests mild congestive heart failure, as above. Electronically Signed   By: Vinnie Langton M.D.   On: 10/13/2016 17:44    PHYSICAL EXAM General: NAD Neck: Thick, difficult to assess JVP think it remains around 8-9 cm, no thyromegaly or  thyroid nodule.  Lungs: Distant breath sounds CV: Nondisplaced PMI.  Heart regular S1/S2, no S3/S4, no murmur.  No peripheral edema.   Abdomen: Soft, nontender, no hepatosplenomegaly, no distention.  Neurologic: Alert and oriented x 3.  Psych: Normal affect. Extremities: No clubbing or cyanosis.   TELEMETRY: Reviewed telemetry pt in NSR  ASSESSMENT AND PLAN: 60 yo with history of COPD/chronic hypoxic respiratory failure on 2 L home oxygen, OSA but not using CPAP, CAD s/p RCA PCI, and diastolic CHF/cor pulmonale presented  with acute on chronic diastolic CHF. 1. Acute on chronic diastolic CHF with cor pulmonale: Echo (10/17) with EF 50-55%, flattened interventricular septum, RV mildly dilated and severely decreased systolic function, no PA pressure estimate. CTA chest with no PE but did show some pulmonary edema.  She appears to still have some volume overload on exam.  - Lasix 40 mg IV bid today, probably to po tomorrow.   2. Pulmonary hypertension: Suspect significant pulmonary hypertension with RV failure on echo.  She carries a history of COPD, but CT chest is not described as showing emphysema.  She has had no PFTs in our system.   - She needs RHC, plan for tomorrow.  If pulmonary arterial hypertension is noted, will need to try to differentiate group 1 and group 3 PH.  Group 3 PH from COPD would be unlikely to improve with pulmonary vasodilators.  However, if there is a group 1 component, vasodilators may help.  Therefore, will need help from pulmonary service in determining the severity of COPD (?would PH be out of proportion to degree of COPD).  PFTs will be helpful, would like her to be fully diuresed first.  3. Chronic hypoxemic respiratory failure: Has been on home oxygen.  Carries diagnosis of COPD but no PFTs in system and CT chest not described as showing emphysema.  If significant PH is present, will need help from pulmonary service to determine if PH is out of proportion to degree of COPD.  4. OSA: Not on CPAP at home but should be.  Use CPAP in hospital.   [name] 10/18/2016 11:21 AM

## 2016-10-19 NOTE — Progress Notes (Signed)
Patient ID: Nancy Castillo, female   DOB: [birth year], 60 y.o.   MRN: [id number]   SUBJECTIVE: Good diuresis again yesterday.  RHC today, see below.   RHC (10/30):  RA mean 16 RV 64/18 PA 61/25, mean 40 PCWP mean 18 Oxygen saturations: PA 52% AO 92% Cardiac Output (Fick) 3.87  Cardiac Index (Fick) 1.8  PVR 5.7 WU  Scheduled Meds: . aspirin EC  81 mg Oral Daily  . budesonide (PULMICORT) nebulizer solution  0.5 mg Nebulization BID  . carvedilol  3.125 mg Oral BID WC  . enoxaparin (LOVENOX) injection  40 mg Subcutaneous Q24H  . furosemide  40 mg Intravenous Q8H  . insulin aspart  0-15 Units Subcutaneous TID WC  . insulin aspart  20 Units Subcutaneous TID WC  . insulin detemir  55 Units Subcutaneous BID  . ipratropium-albuterol  3 mL Nebulization QID  . linaclotide  290 mcg Oral QAC breakfast  . lisinopril  5 mg Oral Daily  . pantoprazole  40 mg Oral Daily  . potassium chloride  40 mEq Oral Daily  . simvastatin  40 mg Oral Daily  . sodium chloride flush  3 mL Intravenous Q12H  . sucralfate  1 g Oral TID   Continuous Infusions:  PRN Meds:.acetaminophen **OR** acetaminophen, ipratropium-albuterol, lactulose, menthol-cetylpyridinium, ondansetron **OR** ondansetron (ZOFRAN) IV, oxyCODONE-acetaminophen, phenol, polyethylene glycol, simethicone    Vitals:   10/19/16 0832 10/19/16 0837 10/19/16 0842 10/19/16 0846  BP: 102/74 104/74 111/85 112/76  Pulse: 99 93 96 92  Resp: '18 13 15 18  '$ Temp:      TempSrc:      SpO2: 91% 92% 93% 92%  Weight:      Height:        Intake/Output Summary (Last 24 hours) at 10/19/16 0900 Last data filed at 10/19/16 0815  Gross per 24 hour  Intake              723 ml  Output             2025 ml  Net            -1302 ml    LABS: Basic Metabolic Panel:  Recent Labs  10/18/16 0444 10/19/16 0452  NA 137 138  K 4.1 4.6  CL 98* 100*  CO2 28 29  GLUCOSE 408* 338*  BUN 20 23*  CREATININE 1.01* 0.80  CALCIUM 9.4 9.7   Liver Function  Tests: No results for input(s): AST, ALT, ALKPHOS, BILITOT, PROT, ALBUMIN in the last 72 hours. No results for input(s): LIPASE, AMYLASE in the last 72 hours. CBC:  Recent Labs  10/19/16 0452  WBC 8.9  HGB 13.6  HCT 45.6  MCV 88.0  PLT 281   Cardiac Enzymes: No results for input(s): CKTOTAL, CKMB, CKMBINDEX, TROPONINI in the last 72 hours. BNP: Invalid input(s): POCBNP D-Dimer: No results for input(s): DDIMER in the last 72 hours. Hemoglobin A1C: No results for input(s): HGBA1C in the last 72 hours. Fasting Lipid Panel: No results for input(s): CHOL, HDL, LDLCALC, TRIG, CHOLHDL, LDLDIRECT in the last 72 hours. Thyroid Function Tests: No results for input(s): TSH, T4TOTAL, T3FREE, THYROIDAB in the last 72 hours.  Invalid input(s): FREET3 Anemia Panel: No results for input(s): VITAMINB12, FOLATE, FERRITIN, TIBC, IRON, RETICCTPCT in the last 72 hours.  RADIOLOGY: Dg Abd 1 View  Result Date: 10/14/2016 CLINICAL DATA:  Bilateral abdominal pain EXAM: ABDOMEN - 1 VIEW COMPARISON:  10/03/2016, 10/02/2016 FINDINGS: Nonspecific nonobstructed gas pattern. No pathologic calcifications. IMPRESSION: Nonspecific nonobstructed gas  pattern Electronically Signed   By: Donavan Foil M.D.   On: 10/14/2016 01:57   Ct Angio Chest Pe W And/or Wo Contrast  Result Date: 10/13/2016 CLINICAL DATA:  Initial evaluation for increased oxygen demand, hypoxia. EXAM: CT ANGIOGRAPHY CHEST WITH CONTRAST TECHNIQUE: Multidetector CT imaging of the chest was performed using the standard protocol during bolus administration of intravenous contrast. Multiplanar CT image reconstructions and MIPs were obtained to evaluate the vascular anatomy. CONTRAST:  90 cc of Isovue 370. COMPARISON:  Prior CT from 09/20/2016 as well as previous radiograph from earlier same day. FINDINGS: Cardiovascular: Intrathoracic aorta a grossly normal and of normal caliber. No definite aortic abnormality, although evaluation limited due to  timing of the contrast bolus. Cardiomegaly is stable from prior. Right ventricular and right atrial enlargement with straightening of the intraventricular septum. No pericardial effusion. Pulmonary arterial tree adequately opacified for evaluation. Main pulmonary artery mildly dilated to 3.2 cm and transaxial diameter. No filling defect to suggest acute pulmonary embolism identified. Re-formatted imaging confirms these findings. From the data bowing for possible small distal emboli somewhat limited on CT moved respiratory motion artifact and body habitus. Mediastinum/Nodes: Visualized thyroid is normal. Scattered enlarged mediastinal lymph nodes measure up to approximately 2 cm in short access, not significantly changed relative to recent CT. Prevascular node measures 15 mm, stable. Right hilar node measures 14 mm. Soft tissue fullness at the left hilum with 16 mm left hilar node also similar. Mildly prominent left axillary node measures up to 11 mm, similar to previous. No right axillary adenopathy. Esophagus within normal limits. Lungs/Pleura: Small layering bilateral pleural effusions. Diffuse interlobular septal thickening suggestive of pulmonary edema. 5 mm right upper lobe nodule is stable (series 6, image 32). Additional 10 x 9 mm nodular density within the posterior left upper lobe also stable (series 6, image 34). Unchanged 5 mm subpleural left upper lobe nodule (series 6, image 42). No focal infiltrates. Linear atelectasis within the right middle lobe and lingula. No pneumothorax. Upper Abdomen: Visualized upper abdomen within normal limits. Musculoskeletal: No acute osseous abnormality. No worrisome lytic or blastic osseous lesions. Review of the MIP images confirms the above findings. IMPRESSION: 1. No CT evidence for acute pulmonary embolism. 2. Diffuse interlobular septal thickening, suggesting mild pulmonary interstitial edema. Small layering bilateral pleural effusions. 3. Dilatation of the main  pulmonary artery to 3.2 cm, suggesting pulmonary hypertension. Right ventricular and right atrial dilatation with mild straightening of the intraventricular septum, suggestive of elevated right sided heart pressures. Findings are similar relative to recent CT. 4. Similar appearance of enlarged mediastinal, hilar, and left axillary lymph nodes as above. Again, while these findings may be related to underlying systemic disease such as sarcoidosis, possible lymphoproliferative disorder could also have this appearance. 5. Bilateral pulmonary nodules, stable relative to previous examinations, presumably benign. Electronically Signed   By: Jeannine Boga M.D.   On: 10/13/2016 22:24   Dg Chest Portable 1 View  Result Date: 10/13/2016 CLINICAL DATA:  60 year old female with history of acute on chronic shortness of breath today. Low oxygen saturations. EXAM: PORTABLE CHEST 1 VIEW COMPARISON:  Chest x-ray 09/30/2016. FINDINGS: There is cephalization of the pulmonary vasculature and slight indistinctness of the interstitial markings suggestive of mild pulmonary edema. Mild cardiomegaly trace bilateral pleural effusions. The patient is rotated to the right on today's exam, resulting in distortion of the mediastinal contours and reduced diagnostic sensitivity and specificity for mediastinal pathology. Atherosclerosis in the thoracic aorta. IMPRESSION: 1. The appearance of the chest  suggests mild congestive heart failure, as above. Electronically Signed   By: Vinnie Langton M.D.   On: 10/13/2016 17:44    PHYSICAL EXAM General: NAD Neck: Thick, difficult to assess JVP think it remains around 8-9 cm, no thyromegaly or thyroid nodule.  Lungs: Distant breath sounds CV: Nondisplaced PMI.  Heart regular S1/S2, no S3/S4, no murmur.  No peripheral edema.   Abdomen: Soft, nontender, no hepatosplenomegaly, no distention.  Neurologic: Alert and oriented x 3.  Psych: Normal affect. Extremities: No clubbing or  cyanosis.   TELEMETRY: Reviewed telemetry pt in NSR  ASSESSMENT AND PLAN: 60 yo with history of COPD/chronic hypoxic respiratory failure on 2 L home oxygen, OSA but not using CPAP, CAD s/p RCA PCI, and diastolic CHF/cor pulmonale presented with acute on chronic diastolic CHF. 1. Acute on chronic diastolic CHF with cor pulmonale: Echo (10/17) with EF 50-55%, flattened interventricular septum, RV mildly dilated and severely decreased systolic function, no PA pressure estimate. CTA chest with no PE but did show some pulmonary edema.  Mildly elevated left heart filling pressure today but still with significantly elevated right heart filling pressure on RHC.  - Lasix 40 mg IV every 8 hrs today, ? to po tomorrow.   2. Pulmonary hypertension: Moderate pulmonary arterial HTN with elevated PVR on RHC 10/30 with RV failure on echo.  She carries a history of COPD, but CT chest is not described as showing emphysema.  She has had no PFTs in our system.   - Will need to try to differentiate group 1 and group 3 PH.  Group 3 PH from COPD would be unlikely to improve with pulmonary vasodilators.  However, if there is a group 1 component, vasodilators may help.  Therefore, will need help from pulmonary service in determining the severity of COPD (?is pulmonary hypertension out of proportion to degree of COPD, suggesting group 1 component).  Will order PFTs to help. 3. Chronic hypoxemic respiratory failure: Has been on home oxygen.  Carries diagnosis of COPD but no PFTs in system and CT chest not described as showing emphysema.  PFTs as above.  4. OSA: Not on CPAP at home but should be.  Use CPAP in hospital.   [name] 10/19/2016 9:00 AM

## 2016-10-19 NOTE — Interval H&P Note (Signed)
History and Physical Interval Note:  10/19/2016 8:23 AM  Nancy Castillo  has presented today for surgery, with the diagnosis of HF  The various methods of treatment have been discussed with the patient and family. After consideration of risks, benefits and other options for treatment, the patient has consented to  Procedure(s): Right Heart Cath (N/A) as a surgical intervention .  The patient's history has been reviewed, patient examined, no change in status, stable for surgery.  I have reviewed the patient's chart and labs.  Questions were answered to the patient's satisfaction.     Praise Stennett [employer]

## 2016-10-19 NOTE — Progress Notes (Signed)
Patient ID: Nancy Castillo, female   DOB: 08-Jul-1956, 60 y.o.   MRN: [id number]  PROGRESS NOTE    Ellene Bloodsaw  [id number]:[id number] DOB: 1956-06-04 DOA: 10/13/2016  PCP: No primary care provider on file.   Brief Narrative:  60 year old obese female with COPD, chronic hypoxic respiratory failure on 3 L oxygen at home, pulmonary hypertension, CAD presented to the ED with epigastric pain and shortness of breath. She was recently hospitalized at [facility] regional for COPD exacerbation. CT angiogram of the chest was negative for PE but showed diffuse septal thickening suggestive of mild pulmonary interstitial edema and pulmonary hypertension. Patient admitted for COPD exacerbation and acute on chronic respiratory failure.  Assessment & Plan:  Principal Problem: Acute on chronic respiratory failure with hypoxia (HCC) / OSA / pulmonary hypertension  - Still on [place] oxygen support via [place] - We will consult pulmonary for evaluation of pulmonary hypertension - Continue Duoneb QID scheduled and Q2 hours PRN shortness of breath or wheezing  - Continue Pulmicort  - Steroids discontinued   Acute cor pulmonale with severe pulmonary hypertension - Echo showed severe pulmonary hypertension and possibly acute right heart failure. - S/P cardiac cath this am, awaiting final result - Continue IV lasix per cardio recommendations - Pulmonary consulted   Active Problems: PUD (peptic ulcer disease - Patient reported having EGD 8 months back showing gastric ulcer - Continue PPI therapy and sucralfate   Uncontrolled diabetes mellitus without complication, with long-term current use of insulin (HCC) - Continue levemir 55 units BID and novolog 20 units TID and SSI  Benign essential HTN - Continue lisinopril and carvedilol   Dyslipidemia associated with type 2 DM - Continue simvastatin    DVT prophylaxis: Lovenox subQ Code Status: full code  Family Communication: husband at the bedside this am Disposition  Plan: home once cleared by cardio    Consultants:   Cardiology  Pulmonary  Procedures:   2 D ECHO  Cardiac cath  10/19/2016   Antimicrobials:   None    Subjective: Still short of breath.   Objective: Vitals:   10/19/16 0913 10/19/16 0919 10/19/16 1034 10/19/16 1200  BP:   105/87   Pulse:      Resp:      Temp:      TempSrc:      SpO2: 95% 94%  92%  Weight:      Height:        Intake/Output Summary (Last 24 hours) at 10/19/16 1300 Last data filed at 10/19/16 0815  Gross per 24 hour  Intake              480 ml  Output             1425 ml  Net             -945 ml   Filed Weights   10/17/16 0552 10/18/16 0558 10/19/16 0523  Weight: 105.5 kg (232 lb 9.6 oz) 105.5 kg (232 lb 8 oz) 104.9 kg (231 lb 3.2 oz)    Examination:  General exam: Appears calm and comfortable  Respiratory system: diminished breath sounds, wheezing in upper lung lobes  Cardiovascular system: S1 & S2 heard, Rate controlled  Gastrointestinal system: Abdomen is nondistended, soft and nontender. No organomegaly or masses felt. Normal bowel sounds heard. Central nervous system: Alert and oriented. No focal neurological deficits. Extremities: Symmetric 5 x 5 power. Skin: No rashes, lesions or ulcers Psychiatry: Judgement and insight appear normal. Mood & affect appropriate.  Data Reviewed: I have personally reviewed following labs and imaging studies  CBC:  Recent Labs Lab 10/13/16 1749 10/14/16 1249 10/19/16 0452  WBC 9.9 9.8 8.9  NEUTROABS  --  7.4  --   HGB 12.1 11.7* 13.6  HCT 40.7 40.8 45.6  MCV 88.1 91.1 88.0  PLT 254 225 732   Basic Metabolic Panel:  Recent Labs Lab 10/13/16 1749 10/14/16 1249 10/17/16 1621 10/18/16 0444 10/19/16 0452  NA 139 142 134* 137 138  K 4.1 3.8 4.6 4.1 4.6  CL 105 104 96* 98* 100*  CO2 '24 29 28 28 29  '$ GLUCOSE 213* 170* 420* 408* 338*  BUN 7 9 22* 20 23*  CREATININE 0.76 0.81 1.01* 1.01* 0.80  CALCIUM 9.1 9.3 9.7 9.4 9.7    GFR: Estimated Creatinine Clearance: 95.5 mL/min (by C-G formula based on SCr of 0.8 mg/dL). Liver Function Tests:  Recent Labs Lab 10/13/16 1749 10/14/16 1249  AST 23 15  ALT 23 20  ALKPHOS 72 64  BILITOT 0.7 0.7  PROT 6.3* 6.4*  ALBUMIN 3.1* 2.9*    Recent Labs Lab 10/13/16 1749  LIPASE 22   No results for input(s): AMMONIA in the last 168 hours. Coagulation Profile: No results for input(s): INR, PROTIME in the last 168 hours. Cardiac Enzymes:  Recent Labs Lab 10/14/16 0250 10/14/16 1249 10/14/16 1832  TROPONINI <0.03 0.03*  <0.03 0.03*   BNP (last 3 results) No results for input(s): PROBNP in the last 8760 hours. HbA1C: No results for input(s): HGBA1C in the last 72 hours. CBG:  Recent Labs Lab 10/18/16 1637 10/18/16 2110 10/18/16 2145 10/19/16 0646 10/19/16 1135  GLUCAP 92 305* 335* 273* 317*   Lipid Profile: No results for input(s): CHOL, HDL, LDLCALC, TRIG, CHOLHDL, LDLDIRECT in the last 72 hours. Thyroid Function Tests: No results for input(s): TSH, T4TOTAL, FREET4, T3FREE, THYROIDAB in the last 72 hours. Anemia Panel: No results for input(s): VITAMINB12, FOLATE, FERRITIN, TIBC, IRON, RETICCTPCT in the last 72 hours. Urine analysis:    Component Value Date/Time   COLORURINE YELLOW 10/13/2016 0002   APPEARANCEUR CLEAR 10/13/2016 0002   LABSPEC 1.010 10/13/2016 0002   PHURINE 5.5 10/13/2016 0002   GLUCOSEU NEGATIVE 10/13/2016 0002   HGBUR NEGATIVE 10/13/2016 0002   BILIRUBINUR NEGATIVE 10/13/2016 0002   KETONESUR NEGATIVE 10/13/2016 0002   PROTEINUR NEGATIVE 10/13/2016 0002   NITRITE NEGATIVE 10/13/2016 0002   LEUKOCYTESUR NEGATIVE 10/13/2016 0002   Sepsis Labs: '@LABRCNTIP'$ (procalcitonin:4,lacticidven:4)   ) Recent Results (from the past 240 hour(s))  MRSA PCR Screening     Status: None   Collection Time: 10/14/16  1:55 AM  Result Value Ref Range Status   MRSA by PCR NEGATIVE NEGATIVE Final    Comment:        The GeneXpert MRSA  Assay (FDA approved for NASAL specimens only), is one component of a comprehensive MRSA colonization surveillance program. It is not intended to diagnose MRSA infection nor to guide or monitor treatment for MRSA infections.       Radiology Studies: No results found.      Scheduled Meds: . aspirin EC  81 mg Oral Daily  . budesonide (PULMICORT) nebulizer solution  0.5 mg Nebulization BID  . carvedilol  3.125 mg Oral BID WC  . [START ON 10/20/2016] enoxaparin (LOVENOX) injection  40 mg Subcutaneous Q24H  . furosemide  40 mg Intravenous Q8H  . insulin aspart  0-15 Units Subcutaneous TID WC  . insulin aspart  20 Units Subcutaneous TID WC  .  insulin detemir  55 Units Subcutaneous BID  . ipratropium-albuterol  3 mL Nebulization QID  . linaclotide  290 mcg Oral QAC breakfast  . lisinopril  5 mg Oral Daily  . pantoprazole  40 mg Oral Daily  . potassium chloride  40 mEq Oral Daily  . simvastatin  40 mg Oral Daily  . sodium chloride flush  3 mL Intravenous Q12H  . sodium chloride flush  3 mL Intravenous Q12H  . sodium chloride flush  3 mL Intravenous Q12H  . sucralfate  1 g Oral TID   Continuous Infusions:    LOS: 5 days     Time spent: 25 minutes  Greater than 50% of the time spent on counseling and coordinating the care.   Leisa Lenz, MD [facility] Pager [phone]  If 7PM-7AM, please contact night-coverage [email or web address] Password [email or web address] 10/19/2016, 1:00 PM

## 2016-10-19 NOTE — Progress Notes (Signed)
Patient states she will most likely not wear CPAP tonight but will have the nurse call me if she changes her mind.

## 2016-10-19 NOTE — Care Management Note (Signed)
Case Management Note  Patient Details  Name: Nancy Castillo MRN: [id number] Date of Birth: August 20, 1956  Subjective/Objective:        CM consult for CPAP           Action/Plan: Patient lives at home with spouse; goes to [facility] is [employer]; has private insurance with [place] with prescription drug coverage; pharmacy of choice is [facility]; patient states that she does not have any problem getting her medication. DME - she has home oxygen through [place]; needs a CPAP but pt states that she owes [facility] $500 and she cannot receive a CPAP until that bill is pain and her insurance will not allow her to go through another DME agency. Patient stated that she is working on paying that bill.  Expected Discharge Date:    possibly 10/19/2016              Expected Discharge Plan:  Home/Self Care  Discharge planning Services  CM Consult, NA  Status of Service:  In process, will continue to follow  Sherrilyn Rist [phone] 10/19/2016, 10:58 AM

## 2016-10-20 ENCOUNTER — Inpatient Hospital Stay (HOSPITAL_COMMUNITY): Payer: BLUE CROSS/BLUE SHIELD

## 2016-10-20 ENCOUNTER — Encounter (HOSPITAL_COMMUNITY): Payer: Self-pay | Admitting: Pulmonary Disease

## 2016-10-20 DIAGNOSIS — J449 Chronic obstructive pulmonary disease, unspecified: Secondary | ICD-10-CM

## 2016-10-20 DIAGNOSIS — I272 Pulmonary hypertension, unspecified: Secondary | ICD-10-CM

## 2016-10-20 DIAGNOSIS — R918 Other nonspecific abnormal finding of lung field: Secondary | ICD-10-CM

## 2016-10-20 DIAGNOSIS — G4733 Obstructive sleep apnea (adult) (pediatric): Secondary | ICD-10-CM

## 2016-10-20 LAB — BASIC METABOLIC PANEL
Anion gap: 10 (ref 5–15)
BUN: 25 mg/dL — AB (ref 6–20)
CHLORIDE: 95 mmol/L — AB (ref 101–111)
CO2: 30 mmol/L (ref 22–32)
Calcium: 9.6 mg/dL (ref 8.9–10.3)
Creatinine, Ser: 0.89 mg/dL (ref 0.44–1.00)
GFR calc Af Amer: >60 mL/min (ref >60)
GFR calc non Af Amer: >60 mL/min (ref >60)
GLUCOSE: 344 mg/dL — AB (ref 65–99)
POTASSIUM: 4.5 mmol/L (ref 3.5–5.1)
SODIUM: 135 mmol/L (ref 135–145)

## 2016-10-20 LAB — CBC
HEMATOCRIT: 48.1 % — AB (ref 36.0–46.0)
Hemoglobin: 14.4 g/dL (ref 12.0–15.0)
MCH: 26.5 pg (ref 26.0–34.0)
MCHC: 29.9 g/dL — ABNORMAL LOW (ref 30.0–36.0)
MCV: 88.4 fL (ref 78.0–100.0)
Platelets: 306 10*3/uL (ref 150–400)
RBC: 5.44 MIL/uL — ABNORMAL HIGH (ref 3.87–5.11)
RDW: 17.5 % — AB (ref 11.5–15.5)
WBC: 9.8 10*3/uL (ref 4.0–10.5)

## 2016-10-20 LAB — GLUCOSE, CAPILLARY
GLUCOSE-CAPILLARY: 242 mg/dL — AB (ref 65–99)
GLUCOSE-CAPILLARY: 196 mg/dL — AB (ref 65–99)
GLUCOSE-CAPILLARY: 266 mg/dL — AB (ref 65–99)
Glucose-Capillary: 339 mg/dL — ABNORMAL HIGH (ref 65–99)

## 2016-10-20 LAB — C-REACTIVE PROTEIN: CRP: 1.5 mg/dL — AB (ref <1.0)

## 2016-10-20 LAB — HEMOGLOBIN A1C
HEMOGLOBIN A1C: 10.9 % — AB (ref 4.8–5.6)
MEAN PLASMA GLUCOSE: 266 mg/dL

## 2016-10-20 LAB — CRYPTOCOCCAL ANTIGEN: CRYPTO AG: NEGATIVE

## 2016-10-20 LAB — SEDIMENTATION RATE: SED RATE: 14 mm/h (ref 0–22)

## 2016-10-20 MED ORDER — ALBUTEROL SULFATE (2.5 MG/3ML) 0.083% IN NEBU
2.5000 mg | INHALATION_SOLUTION | Freq: Once | RESPIRATORY_TRACT | Status: AC
Start: 2016-10-20 — End: 2016-10-20
  Administered 2016-10-20: 2.5 mg via RESPIRATORY_TRACT

## 2016-10-20 MED ORDER — FUROSEMIDE 40 MG PO TABS
40.0000 mg | ORAL_TABLET | Freq: Every day | ORAL | Status: DC
  Administered 2016-10-20 – 2016-10-22 (×3): 40 mg via ORAL
  Filled 2016-10-20 (×3): qty 1

## 2016-10-20 MED ORDER — INSULIN ASPART 100 UNIT/ML ~~LOC~~ SOLN
25.0000 [IU] | Freq: Three times a day (TID) | SUBCUTANEOUS | Status: DC
  Administered 2016-10-20 – 2016-10-22 (×8): 25 [IU] via SUBCUTANEOUS

## 2016-10-20 MED FILL — Heparin Sodium (Porcine) 2 Unit/ML in Sodium Chloride 0.9%: INTRAMUSCULAR | Qty: 500 | Status: AC

## 2016-10-20 NOTE — Progress Notes (Signed)
Pt states that she will use NIV tonight (after midnight she stated specifically) RT is covering the Emergency Room also. No distress or complications noted.

## 2016-10-20 NOTE — Progress Notes (Signed)
Name: Nancy Castillo MRN: [id number] DOB: 1955-12-27    ADMISSION DATE:  10/13/2016 CONSULTATION DATE:  10/16/16  REFERRING MD :  Dr. Clementeen Graham   CHIEF COMPLAINT:  SOB    HISTORY OF PRESENT ILLNESS:  60 y/o F, former smoker (quit August 2017) with PMH of DM, HTN, CAD s/p stent to RCA, OSA - unable to get CPAP due to financial balance, COPD, chronic hypoxic respiratory failure on 3.5-4L at baseline (followed by Dr. [name] at [facility]), pulmonary hypertension, pulmonary nodules (ANA is 1:160 speckled, she has arthritis per Dr. [name]) and prior lobectomy [facility], Dr. Lianne Moris, RML lobectomy 2013) who was admitted on 10/24 with what she describes as "abdominal pain".  She reports her baseline is essentially lying in bed all day.  She "does not get up for much due to shortness of breath and feeling tired".  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   On a "good day" she mostly lays in bed because when she gets up she gets short of breath with activity and has "stomach pain".  She feels tired all the time.    The patient presented to [facility] on 10/24 with "abdominal pain".  When asked to point out where she hurts, she points to her left chest and mid-epigastric area.  She denies fevers, chills, chest pain, pain with inspiration.  The patient was found to be hypoxic on admit requiring increased O2.  She denies current issues with her breathing. The patient just participated with PT and was able to ambulate without difficulty.  Her observed limiting factor is shortness of breath.  She reports abdominal bloating and LE swelling at times.  She reports she is on multiple pulmonary agents > some overlapping from recent discharge at [facility].  Currently using PRN albuterol, Breo, Advair and incruse.  However, she was unable to get Incruse due to no prescription. She does not wear CPAP due to financial issues.   Of note, she was last seen at [facility] and weight noted to be 230lbs.    PCCM consulted 10/27 for acute on chronic  hypoxic respiratory failure.    SUBJECTIVE: Pt reports she is planned to go for PFT's this am.  No acute complaints.  O2 to 3L   VITAL SIGNS: Temp:  [97.3 F (36.3 C)-97.6 F (36.4 C)] 97.6 F (36.4 C) (10/31 0440) Pulse Rate:  [80-102] 102 (10/31 0855) Resp:  [18] 18 (10/31 0440) BP: (97-113)/(63-87) 103/67 (10/31 0855) SpO2:  [90 %-96 %] 95 % (10/31 0834) Weight:  [230 lb (104.3 kg)] 230 lb (104.3 kg) (10/31 0229)  PHYSICAL EXAMINATION: General:  Morbidly obese female in NAD, working with PT Neuro:  AAOx4, speech clear, MAE, good strength PSY: pleasant / cooperative  HEENT:  MM pink/moist, no jvd Cardiovascular:  s1s2 rrr, no m/r/g Lungs:  Even/non-labored, lungs bilaterally diminished but clear  Abdomen:  Obese/soft, bsx4 active  Musculoskeletal:  No acute deformities  Skin:  Warm/dry, no edema    Recent Labs Lab 10/18/16 0444 10/19/16 0452 10/20/16 0429  NA 137 138 135  K 4.1 4.6 4.5  CL 98* 100* 95*  CO2 '28 29 30  '$ BUN 20 23* 25*  CREATININE 1.01* 0.80 0.89  GLUCOSE 408* 338* 344*    Recent Labs Lab 10/14/16 1249 10/19/16 0452 10/20/16 0429  HGB 11.7* 13.6 14.4  HCT 40.8 45.6 48.1*  WBC 9.8 8.9 9.8  PLT 225 281 306   No results found.   SIGNIFICANT EVENTS  10/24  Admit to [facility] with complaints  of ABD pain, found to be hypoxic 10/27  PCCM consulted with acute on chronic hypoxic respiratory failure   STUDIES:  CT ABD/Pelvis 10/02/16 >> focal area of low attenuation within the inferior aspect of the liver, may represent evolution of a subacute to chronic hematoma, edematous gallbladder likely due to underlying liver disease, fibroid uterus  RUQ [place] 10/14 /17 >> normal gallbladder, wall thickening on CT may have been due to gallbladder contraction, ? Resolving liver laceration CTA Chest 09/30/16 >> no PE, dilation of the pulmonic trunk, suggestive of PAH, cardiomegaly with concerns of elevated R heart pressures, mild pulmonary edema, worsening LAN in the  mediastinal & hilar nodes, all pulmonary nodules stable compared to prior exams (read by Madie Reno) ECHO 10/26 >> low normal to mildy reduced LV systolic function, LVEF 88%, grade 1 diastolic dysfunction, D shaped septum, severely reduced RV function  RHC 10/31  RA mean 16 RV 64/18 PA 61/25, mean 40 PCWP mean 18 Oxygen saturations: PA 52% AO 92% Cardiac Output (Fick) 3.87  Cardiac Index (Fick) 1.8  PVR 5.7 WU   ASSESSMENT / PLAN:  Acute on Chronic Hypoxic Respiratory Failure - in the setting of moderate pulmonary hypertension, COPD, ongoing tobacco abuse, CAD, non-compliance with CPAP. Baseline hypercapnia.  She often increases O2 up to 6L at home.  Doubt she is far from her baseline.    Plan: O2 goal 90% Pulmonary hygiene - IS, mobilize  PT Intermittent CXR  Moderate Pulmonary Hypertension - appreciate cardiology assistance, RHC findings noted as above.  Possible PAH 1 or 3.    Plan: Await PFT's pending to better define COPD  Lasix per CHF Service  Monitor I/O's  Pending PFT assessment, may need further autoimmune eval  Severe COPD - unable to find baseline PFT's but per notes at [facility] > spirometry shows severe obstruction with low FVC.    Plan: Discharge regimen plan > Breo, Incruse and PRN albuterol.  She will need prescriptions for above at discharge  Follow up with Dr. [name] at [facility] at discharge  Budesonide BID Continue duonebs  OSA - noncompliant with CPAP due to financial issues   Plan: Autoset CPAP QHS  Will ask CM to assess situation with [facility] > reportedly owes 500$ balance before she can obtain machine   Pulmonary Nodules - worked up by [facility] for autoimmune disease, ANA is 1:160 speckled, she has arthritis, RF <8.6 06/22/16.  HIV negative.  Hx of RML lobectomy / VATS in 2013 with benign findings  Plan: Monitor for now ? Underlying autoimmune process contributing to pulmonary nodules Repeat ANA, RF   Recent Tobacco Abuse - quit 07/2016 per report    Plan: Smoking cessation counseling  Husband counseled as well, reports he quit as well  Noe Gens, NP-C [facility] Pgr: [phone] or if no answer [phone] 10/20/2016, 10:15 AM

## 2016-10-20 NOTE — Progress Notes (Addendum)
Patient ID: Nancy Castillo, female   DOB: August 30, 1956, 60 y.o.   MRN: [id number]  PROGRESS NOTE    Zakiyah Diop  KVQ:[id number] DOB: [birth year] DOA: 10/13/2016  PCP: No primary care provider on file.   Brief Narrative:  60 year old obese female with COPD, chronic hypoxic respiratory failure on 3 L oxygen at home, pulmonary hypertension, CAD presented to the ED with epigastric pain and shortness of breath. She was recently hospitalized at [facility] regional for COPD exacerbation. CT angiogram of the chest was negative for PE but showed diffuse septal thickening suggestive of mild pulmonary interstitial edema and pulmonary hypertension. Patient admitted for COPD exacerbation, acute diastolic CHF and pulmonary hypertension.   Assessment & Plan:  Principal Problem: Acute on chronic respiratory failure with hypoxia (HCC) / pulmonary hypertension and acute diastolic CHF - Still on [place] oxygen support via [place] - Appreciate cardiology following - Pulmonary consulted, appreciate their assistance as well - Continue Duoneb QID scheduled and Q2 hours PRN shortness of breath or wheezing  - Continue Pulmicort  - Steroids discontinued at this point   Active Problems: Acute on chronic diastolic CHF with cor pulmonale - 2-D echo with ejection fraction 50-55%, right ventricle mildly dilated and severely decreased systolic function - CT angiogram of the chest with no PE but it did show pulmonary edema - Right heart catheterization done 10/19/2016 with left heart filling pressure elevated, significantly elevated right heart filling pressure - She was on IV Lasix but per cardiology will be transitioned to by mouth regimen today  Moderate to severe pulmonary arterial hypertension - Echo showed severe pulmonary hypertension and possibly acute right heart failure. - S/P cardiac cath - showed mildly elevated left heart filling pressure and significantly elevated right heart filling pressure - Appreciate  pulmonary consult and recommendations - Will have to differentiate from type I pulmonary hypertension and type III pulmonary hypertension - Follow-up PFT results  OSA - Will need sleep study - Appreciate pulm recommendations   PUD (peptic ulcer disease - Patient reported having EGD 8 months back showing gastric ulcer - Continue PPI therapy and sucralfate   Uncontrolled diabetes mellitus without complication, with long-term current use of insulin (HCC) - Current insulin regimen includes levemir 55 units BID and novolog 20 units TID and SSI - CBG's in past 24 hours: 351, 318, 339 - Will increase novolog to 25 units TID   Benign essential HTN - Continue lisinopril and carvedilol   Dyslipidemia associated with type 2 DM - Continue simvastatin    DVT prophylaxis: Lovenox subQ Code Status: full code  Family Communication: No family at the bedside, hospital at the bedside yesterday morning Disposition Plan: Patient will be transitioned to by mouth Lasix today, she will also have evaluation by pulmonary. Anticipated discharge in the next 1-2 days depending on pulmonary evaluation   Consultants:   Cardiology  Pulmonary  Procedures:   2 D ECHO  Cardiac cath  10/19/2016   Antimicrobials:   None    Subjective: Still short of breath.   Objective: Vitals:   10/20/16 0440 10/20/16 0830 10/20/16 0834 10/20/16 0855  BP: 113/63   103/67  Pulse: 80   (!) 102  Resp: 18     Temp: 97.6 F (36.4 C)     TempSrc: Oral     SpO2: 95% 96% 95%   Weight:      Height:        Intake/Output Summary (Last 24 hours) at 10/20/16 1007 Last data filed at 10/20/16 6433  Gross per 24 hour  Intake              123 ml  Output                2 ml  Net              121 ml   Filed Weights   10/18/16 0558 10/19/16 0523 10/20/16 0229  Weight: 105.5 kg (232 lb 8 oz) 104.9 kg (231 lb 3.2 oz) 104.3 kg (230 lb)    Examination:  General exam: Appears calm and comfortable, no distress    Respiratory system: diminished breath sounds, mild wheezing in upper lung lobes, crackles at bases  Cardiovascular system: S1 & S2 heard, Rate controlled  Gastrointestinal system: (+) BS, non tender abdomen  Central nervous system: No focal neurological deficits. Extremities: No swelling, palpable pulses  Skin: Skin is warm and dry  Psychiatry: Normal mood and behavior   Data Reviewed: I have personally reviewed following labs and imaging studies  CBC:  Recent Labs Lab 10/13/16 1749 10/14/16 1249 10/19/16 0452 10/20/16 0429  WBC 9.9 9.8 8.9 9.8  NEUTROABS  --  7.4  --   --   HGB 12.1 11.7* 13.6 14.4  HCT 40.7 40.8 45.6 48.1*  MCV 88.1 91.1 88.0 88.4  PLT 254 225 281 034   Basic Metabolic Panel:  Recent Labs Lab 10/14/16 1249 10/17/16 1621 10/18/16 0444 10/19/16 0452 10/20/16 0429  NA 142 134* 137 138 135  K 3.8 4.6 4.1 4.6 4.5  CL 104 96* 98* 100* 95*  CO2 '29 28 28 29 30  '$ GLUCOSE 170* 420* 408* 338* 344*  BUN 9 22* 20 23* 25*  CREATININE 0.81 1.01* 1.01* 0.80 0.89  CALCIUM 9.3 9.7 9.4 9.7 9.6   GFR: Estimated Creatinine Clearance: 85.6 mL/min (by C-G formula based on SCr of 0.89 mg/dL). Liver Function Tests:  Recent Labs Lab 10/13/16 1749 10/14/16 1249  AST 23 15  ALT 23 20  ALKPHOS 72 64  BILITOT 0.7 0.7  PROT 6.3* 6.4*  ALBUMIN 3.1* 2.9*    Recent Labs Lab 10/13/16 1749  LIPASE 22   No results for input(s): AMMONIA in the last 168 hours. Coagulation Profile: No results for input(s): INR, PROTIME in the last 168 hours. Cardiac Enzymes:  Recent Labs Lab 10/14/16 0250 10/14/16 1249 10/14/16 1832  TROPONINI <0.03 0.03*  <0.03 0.03*   BNP (last 3 results) No results for input(s): PROBNP in the last 8760 hours. HbA1C:  Recent Labs  10/19/16 0452  HGBA1C 10.9*   CBG:  Recent Labs Lab 10/19/16 0646 10/19/16 1135 10/19/16 1706 10/19/16 2126 10/20/16 0556  GLUCAP 273* 317* 351* 318* 339*   Lipid Profile: No results for  input(s): CHOL, HDL, LDLCALC, TRIG, CHOLHDL, LDLDIRECT in the last 72 hours. Thyroid Function Tests: No results for input(s): TSH, T4TOTAL, FREET4, T3FREE, THYROIDAB in the last 72 hours. Anemia Panel: No results for input(s): VITAMINB12, FOLATE, FERRITIN, TIBC, IRON, RETICCTPCT in the last 72 hours. Urine analysis:    Component Value Date/Time   COLORURINE YELLOW 10/13/2016 0002   APPEARANCEUR CLEAR 10/13/2016 0002   LABSPEC 1.010 10/13/2016 0002   PHURINE 5.5 10/13/2016 0002   GLUCOSEU NEGATIVE 10/13/2016 0002   HGBUR NEGATIVE 10/13/2016 0002   BILIRUBINUR NEGATIVE 10/13/2016 0002   KETONESUR NEGATIVE 10/13/2016 0002   PROTEINUR NEGATIVE 10/13/2016 0002   NITRITE NEGATIVE 10/13/2016 0002   LEUKOCYTESUR NEGATIVE 10/13/2016 0002   Sepsis Labs: '@LABRCNTIP'$ (procalcitonin:4,lacticidven:4)   Recent Results (from the  past 240 hour(s))  MRSA PCR Screening     Status: None   Collection Time: 10/14/16  1:55 AM  Result Value Ref Range Status   MRSA by PCR NEGATIVE NEGATIVE Final    Comment:        The GeneXpert MRSA Assay (FDA approved for NASAL specimens only), is one component of a comprehensive MRSA colonization surveillance program. It is not intended to diagnose MRSA infection nor to guide or monitor treatment for MRSA infections.       Radiology Studies: No results found.    Scheduled Meds: . aspirin EC  81 mg Oral Daily  . budesonide (PULMICORT) nebulizer solution  0.5 mg Nebulization BID  . carvedilol  3.125 mg Oral BID WC  . enoxaparin (LOVENOX) injection  40 mg Subcutaneous Q24H  . furosemide  40 mg Oral Daily  . insulin aspart  0-15 Units Subcutaneous TID WC  . insulin aspart  20 Units Subcutaneous TID WC  . insulin detemir  55 Units Subcutaneous BID  . ipratropium-albuterol  3 mL Nebulization QID  . linaclotide  290 mcg Oral QAC breakfast  . lisinopril  5 mg Oral Daily  . pantoprazole  40 mg Oral Daily  . potassium chloride  40 mEq Oral Daily  .  simvastatin  40 mg Oral Daily  . sodium chloride flush  3 mL Intravenous Q12H  . sodium chloride flush  3 mL Intravenous Q12H  . sodium chloride flush  3 mL Intravenous Q12H  . sucralfate  1 g Oral TID   Continuous Infusions:    LOS: 6 days     Time spent: 25 minutes  Greater than 50% of the time spent on counseling and coordinating the care.   Leisa Lenz, MD [facility] Pager [phone]  If 7PM-7AM, please contact night-coverage [email or web address] Password [email or web address] 10/20/2016, 10:07 AM

## 2016-10-20 NOTE — Progress Notes (Signed)
Patient ID: Nancy Castillo, female   DOB: 1956/06/22, 60 y.o.   MRN: [id number]   SUBJECTIVE:  RHC 10/30 with high right-sided pressures and mildly elevated left-sided pressures.  Feeling better today. Breathing feels like it has improved (just finished neb). No PFTs yet  1.2 L UO yesterday, ?accuracy. Intake not recorded.  Weight down 1 lb. Creatinine stable.   RHC (10/30):  RA mean 16 RV 64/18 PA 61/25, mean 40 PCWP mean 18 Oxygen saturations: PA 52% AO 92% Cardiac Output (Fick) 3.87  Cardiac Index (Fick) 1.8  PVR 5.7 WU  Scheduled Meds: . aspirin EC  81 mg Oral Daily  . budesonide (PULMICORT) nebulizer solution  0.5 mg Nebulization BID  . carvedilol  3.125 mg Oral BID WC  . enoxaparin (LOVENOX) injection  40 mg Subcutaneous Q24H  . furosemide  40 mg Intravenous Q8H  . insulin aspart  0-15 Units Subcutaneous TID WC  . insulin aspart  20 Units Subcutaneous TID WC  . insulin detemir  55 Units Subcutaneous BID  . ipratropium-albuterol  3 mL Nebulization QID  . linaclotide  290 mcg Oral QAC breakfast  . lisinopril  5 mg Oral Daily  . pantoprazole  40 mg Oral Daily  . potassium chloride  40 mEq Oral Daily  . simvastatin  40 mg Oral Daily  . sodium chloride flush  3 mL Intravenous Q12H  . sodium chloride flush  3 mL Intravenous Q12H  . sodium chloride flush  3 mL Intravenous Q12H  . sucralfate  1 g Oral TID   Continuous Infusions:  PRN Meds:.sodium chloride, sodium chloride, acetaminophen **OR** acetaminophen, acetaminophen, ipratropium-albuterol, lactulose, menthol-cetylpyridinium, ondansetron **OR** ondansetron (ZOFRAN) IV, ondansetron (ZOFRAN) IV, oxyCODONE-acetaminophen, phenol, polyethylene glycol, simethicone, sodium chloride flush, sodium chloride flush    Vitals:   10/19/16 2104 10/19/16 2143 10/20/16 0229 10/20/16 0440  BP:  97/70  113/63  Pulse:  100  80  Resp:  18  18  Temp:  97.3 F (36.3 C)  97.6 F (36.4 C)  TempSrc:  Oral  Oral  SpO2: 92% 91%  95%    Weight:   230 lb (104.3 kg)   Height:        Intake/Output Summary (Last 24 hours) at 10/20/16 0827 Last data filed at 10/20/16 0137  Gross per 24 hour  Intake                3 ml  Output                2 ml  Net                1 ml    LABS: Basic Metabolic Panel:  Recent Labs  10/19/16 0452 10/20/16 0429  NA 138 135  K 4.6 4.5  CL 100* 95*  CO2 29 30  GLUCOSE 338* 344*  BUN 23* 25*  CREATININE 0.80 0.89  CALCIUM 9.7 9.6   Liver Function Tests: No results for input(s): AST, ALT, ALKPHOS, BILITOT, PROT, ALBUMIN in the last 72 hours. No results for input(s): LIPASE, AMYLASE in the last 72 hours. CBC:  Recent Labs  10/19/16 0452 10/20/16 0429  WBC 8.9 9.8  HGB 13.6 14.4  HCT 45.6 48.1*  MCV 88.0 88.4  PLT 281 306   Cardiac Enzymes: No results for input(s): CKTOTAL, CKMB, CKMBINDEX, TROPONINI in the last 72 hours. BNP: Invalid input(s): POCBNP D-Dimer: No results for input(s): DDIMER in the last 72 hours. Hemoglobin A1C:  Recent Labs  10/19/16 0452  HGBA1C 10.9*   Fasting Lipid Panel: No results for input(s): CHOL, HDL, LDLCALC, TRIG, CHOLHDL, LDLDIRECT in the last 72 hours. Thyroid Function Tests: No results for input(s): TSH, T4TOTAL, T3FREE, THYROIDAB in the last 72 hours.  Invalid input(s): FREET3 Anemia Panel: No results for input(s): VITAMINB12, FOLATE, FERRITIN, TIBC, IRON, RETICCTPCT in the last 72 hours.  RADIOLOGY: Dg Abd 1 View  Result Date: 10/14/2016 CLINICAL DATA:  Bilateral abdominal pain EXAM: ABDOMEN - 1 VIEW COMPARISON:  10/03/2016, 10/02/2016 FINDINGS: Nonspecific nonobstructed gas pattern. No pathologic calcifications. IMPRESSION: Nonspecific nonobstructed gas pattern Electronically Signed   By: Donavan Foil M.D.   On: 10/14/2016 01:57   Ct Angio Chest Pe W And/or Wo Contrast  Result Date: 10/13/2016 CLINICAL DATA:  Initial evaluation for increased oxygen demand, hypoxia. EXAM: CT ANGIOGRAPHY CHEST WITH CONTRAST TECHNIQUE:  Multidetector CT imaging of the chest was performed using the standard protocol during bolus administration of intravenous contrast. Multiplanar CT image reconstructions and MIPs were obtained to evaluate the vascular anatomy. CONTRAST:  90 cc of Isovue 370. COMPARISON:  Prior CT from 09/20/2016 as well as previous radiograph from earlier same day. FINDINGS: Cardiovascular: Intrathoracic aorta a grossly normal and of normal caliber. No definite aortic abnormality, although evaluation limited due to timing of the contrast bolus. Cardiomegaly is stable from prior. Right ventricular and right atrial enlargement with straightening of the intraventricular septum. No pericardial effusion. Pulmonary arterial tree adequately opacified for evaluation. Main pulmonary artery mildly dilated to 3.2 cm and transaxial diameter. No filling defect to suggest acute pulmonary embolism identified. Re-formatted imaging confirms these findings. From the data bowing for possible small distal emboli somewhat limited on CT moved respiratory motion artifact and body habitus. Mediastinum/Nodes: Visualized thyroid is normal. Scattered enlarged mediastinal lymph nodes measure up to approximately 2 cm in short access, not significantly changed relative to recent CT. Prevascular node measures 15 mm, stable. Right hilar node measures 14 mm. Soft tissue fullness at the left hilum with 16 mm left hilar node also similar. Mildly prominent left axillary node measures up to 11 mm, similar to previous. No right axillary adenopathy. Esophagus within normal limits. Lungs/Pleura: Small layering bilateral pleural effusions. Diffuse interlobular septal thickening suggestive of pulmonary edema. 5 mm right upper lobe nodule is stable (series 6, image 32). Additional 10 x 9 mm nodular density within the posterior left upper lobe also stable (series 6, image 34). Unchanged 5 mm subpleural left upper lobe nodule (series 6, image 42). No focal infiltrates. Linear  atelectasis within the right middle lobe and lingula. No pneumothorax. Upper Abdomen: Visualized upper abdomen within normal limits. Musculoskeletal: No acute osseous abnormality. No worrisome lytic or blastic osseous lesions. Review of the MIP images confirms the above findings. IMPRESSION: 1. No CT evidence for acute pulmonary embolism. 2. Diffuse interlobular septal thickening, suggesting mild pulmonary interstitial edema. Small layering bilateral pleural effusions. 3. Dilatation of the main pulmonary artery to 3.2 cm, suggesting pulmonary hypertension. Right ventricular and right atrial dilatation with mild straightening of the intraventricular septum, suggestive of elevated right sided heart pressures. Findings are similar relative to recent CT. 4. Similar appearance of enlarged mediastinal, hilar, and left axillary lymph nodes as above. Again, while these findings may be related to underlying systemic disease such as sarcoidosis, possible lymphoproliferative disorder could also have this appearance. 5. Bilateral pulmonary nodules, stable relative to previous examinations, presumably benign. Electronically Signed   By: Jeannine Boga M.D.   On: 10/13/2016 22:24   Dg Chest Portable 1 View  Result Date: 10/13/2016 CLINICAL DATA:  60 year old female with history of acute on chronic shortness of breath today. Low oxygen saturations. EXAM: PORTABLE CHEST 1 VIEW COMPARISON:  Chest x-ray 09/30/2016. FINDINGS: There is cephalization of the pulmonary vasculature and slight indistinctness of the interstitial markings suggestive of mild pulmonary edema. Mild cardiomegaly trace bilateral pleural effusions. The patient is rotated to the right on today's exam, resulting in distortion of the mediastinal contours and reduced diagnostic sensitivity and specificity for mediastinal pathology. Atherosclerosis in the thoracic aorta. IMPRESSION: 1. The appearance of the chest suggests mild congestive heart failure, as  above. Electronically Signed   By: Vinnie Langton M.D.   On: 10/13/2016 17:44    PHYSICAL EXAM General: NAD Neck: Thick, JVP difficult to assess, appears ~ 8 cm, no thyromegaly or thyroid nodule.  Lungs: Distant breath sounds CV: Nondisplaced PMI.  Heart regular S1/S2, no S3/S4, no murmur.  No peripheral edema.   Abdomen: Obese, soft, NT, ND, no HSM. No bruits or masses. +BS  Neurologic: Alert and oriented x 3.  Psych: Normal affect. Extremities: No clubbing or cyanosis.   TELEMETRY: Reviewed, NSR/ST 90-100s.  ASSESSMENT AND PLAN: 60 yo with history of COPD/chronic hypoxic respiratory failure on 2 L home oxygen, OSA but not using CPAP, CAD s/p RCA PCI, and diastolic CHF/cor pulmonale presented with acute on chronic diastolic CHF.  1. Acute on chronic diastolic CHF with cor pulmonale: Echo (10/17) with EF 50-55%, flattened interventricular septum, RV mildly dilated and severely decreased systolic function, no PA pressure estimate. CTA chest with no PE but did show some pulmonary edema.  Mildly elevated left heart filling pressure on RHC 10/19/16 but still with significantly elevated right heart filling pressure on RHC.  - Got dose of IV lasix at 0136 am. Will stop IV lasix and transition to po this am.  - She had 20 mg daily PRN ordered PTA and was taking since her recent hospitalization per patient. Will start on lasix 40 mg daily for now   2. Pulmonary hypertension: Moderate pulmonary arterial HTN with elevated PVR on RHC 10/30 with RV failure on echo.  She carries a history of COPD, but CT chest is not described as showing emphysema.  She has had no PFTs in our system.   - Need to try and differentiate group 1 and group 3 PH.  - Group 3 PH from COPD would be unlikely to improve with pulmonary vasodilators.   - Group 1 PH component may benefit from vasodilators. - Recommend pulmonary service help determining the severity of COPD (?is pulmonary hypertension out of proportion to degree of  COPD, suggesting group 1 component). - PFTs have been ordered.  3. Chronic hypoxemic respiratory failure: Has been on home oxygen.  Carries diagnosis of COPD but no PFTs in system and CT chest not described as showing emphysema.   - PFTs ordered.   4. OSA: Not on CPAP at home but should be.  Use CPAP in hospital.  - Will need sleep study as outpatient.   Nancy Friar, PA-C 10/20/2016 8:27 AM  Advanced Heart Failure Team Pager [phone] (M-F; 7a - 4p)  Please contact [facility] for night-coverage after hours (4p -7a ) and weekends on [email or web address]   Patient seen with PA, agree with the above note.   Volume status overall improved, can transition to Lasix 40 mg daily with KCl 20 daily for home.   She has pulmonary arterial hypertension (moderate) by RHC.  To get PFTs today.  If  pulmonary hypertension looks out of proportion to degree of COPD (possible group 1 PH component), then she may benefit from pulmonary vasodilators.  Would have pulmonary comment on PFTs after they are completed.  If COPD does not look severe, would plan to try her on pulmonary vasodilator (will need to come from specialty pharmacy so can set up as outpatient).   Nancy Castillo 10/20/2016 9:11 AM

## 2016-10-21 DIAGNOSIS — E0865 Diabetes mellitus due to underlying condition with hyperglycemia: Secondary | ICD-10-CM

## 2016-10-21 DIAGNOSIS — E089 Diabetes mellitus due to underlying condition without complications: Secondary | ICD-10-CM

## 2016-10-21 DIAGNOSIS — K279 Peptic ulcer, site unspecified, unspecified as acute or chronic, without hemorrhage or perforation: Secondary | ICD-10-CM

## 2016-10-21 DIAGNOSIS — E1169 Type 2 diabetes mellitus with other specified complication: Secondary | ICD-10-CM

## 2016-10-21 DIAGNOSIS — Z794 Long term (current) use of insulin: Secondary | ICD-10-CM

## 2016-10-21 DIAGNOSIS — E785 Hyperlipidemia, unspecified: Secondary | ICD-10-CM

## 2016-10-21 LAB — CBC
HCT: 45.8 % (ref 36.0–46.0)
Hemoglobin: 13.6 g/dL (ref 12.0–15.0)
MCH: 25.8 pg — AB (ref 26.0–34.0)
MCHC: 29.7 g/dL — AB (ref 30.0–36.0)
MCV: 86.9 fL (ref 78.0–100.0)
PLATELETS: 314 10*3/uL (ref 150–400)
RBC: 5.27 MIL/uL — ABNORMAL HIGH (ref 3.87–5.11)
RDW: 16.8 % — AB (ref 11.5–15.5)
WBC: 9.2 10*3/uL (ref 4.0–10.5)

## 2016-10-21 LAB — MISC LABCORP TEST (SEND OUT): Labcorp test code: 164814

## 2016-10-21 LAB — BASIC METABOLIC PANEL
ANION GAP: 10 (ref 5–15)
BUN: 22 mg/dL — AB (ref 6–20)
CALCIUM: 9.5 mg/dL (ref 8.9–10.3)
CO2: 27 mmol/L (ref 22–32)
CREATININE: 0.84 mg/dL (ref 0.44–1.00)
Chloride: 98 mmol/L — ABNORMAL LOW (ref 101–111)
GFR calc Af Amer: >60 mL/min (ref >60)
GLUCOSE: 254 mg/dL — AB (ref 65–99)
Potassium: 4.9 mmol/L (ref 3.5–5.1)
Sodium: 135 mmol/L (ref 135–145)

## 2016-10-21 LAB — GLUCOSE, CAPILLARY
GLUCOSE-CAPILLARY: 380 mg/dL — AB (ref 65–99)
GLUCOSE-CAPILLARY: 340 mg/dL — AB (ref 65–99)
GLUCOSE-CAPILLARY: 269 mg/dL — AB (ref 65–99)
Glucose-Capillary: 169 mg/dL — ABNORMAL HIGH (ref 65–99)

## 2016-10-21 MED ORDER — INSULIN ASPART 100 UNIT/ML ~~LOC~~ SOLN: 0.0000 [IU] | Freq: Every day | SUBCUTANEOUS | Status: DC

## 2016-10-21 MED ORDER — INSULIN ASPART 100 UNIT/ML ~~LOC~~ SOLN
0.0000 [IU] | Freq: Three times a day (TID) | SUBCUTANEOUS | Status: DC
  Administered 2016-10-21: 8 [IU] via SUBCUTANEOUS
  Administered 2016-10-22 (×2): 5 [IU] via SUBCUTANEOUS
  Administered 2016-10-22: 3 [IU] via SUBCUTANEOUS

## 2016-10-21 MED ORDER — POTASSIUM CHLORIDE CRYS ER 20 MEQ PO TBCR
20.0000 meq | EXTENDED_RELEASE_TABLET | Freq: Every day | ORAL | Status: DC
  Administered 2016-10-22: 20 meq via ORAL
  Filled 2016-10-21: qty 1

## 2016-10-21 MED ORDER — INSULIN DETEMIR 100 UNIT/ML ~~LOC~~ SOLN
60.0000 [IU] | Freq: Two times a day (BID) | SUBCUTANEOUS | Status: DC
  Administered 2016-10-21 – 2016-10-22 (×2): 60 [IU] via SUBCUTANEOUS
  Filled 2016-10-21 (×3): qty 0.6

## 2016-10-21 NOTE — Progress Notes (Signed)
Pt refuse NIV for the night. Told patient if change her mind to please notify RN to notify RT

## 2016-10-21 NOTE — Progress Notes (Signed)
Patient ID: Nancy Castillo, female   DOB: 06/19/1956, 60 y.o.   MRN: [id number]  PROGRESS NOTE    Nancy Castillo  RXV:[id number] DOB: 1956-05-12 DOA: 10/13/2016  PCP: No primary care provider on file.   Brief Narrative:  60 year old obese female with COPD, chronic hypoxic respiratory failure on 3 L oxygen at home, pulmonary hypertension, CAD presented to the ED with epigastric pain and shortness of breath. She was recently hospitalized at [facility] for COPD exacerbation. CT angiogram of the chest was negative for PE but showed diffuse septal thickening suggestive of mild pulmonary interstitial edema and pulmonary hypertension. Patient admitted for COPD exacerbation, acute diastolic CHF and pulmonary hypertension.   Assessment & Plan:  1. Acute on chronic respiratory failure with hypoxia: Multifactorial due to pulmonary hypertension, COPD, ongoing tobacco abuse, noncompliance with CPAP. Baseline hypercapnia.. Patient feels that her breathing is back to baseline. Apparently on 2 L/m oxygen at home but at times increases to 6 L/m. 2. Acute on chronic diastolic CHF with cor pulmonale: Cardiology follow-up appreciated. Echo 10/06/16: EF 50-55 percent, flattened intraventricular septum, RV mildly dilated and severely decreased systolic function, no PA pressure estimated. CTA chest: No PE but showed some pulmonary edema. RNC results as below. Clinically compensated. As per cardiology, continue Lasix 40 mg by mouth daily at home which he was not on prior to admission. 3. Pulmonary hypertension: moderate pulmonary artery hypertension by RHC 10/30 with RV failure on echo. PFT showed severe COPD. No definite interstitial lung disease or PE on CT chest. Follow-up autoimmune workup as outpatient. Pulmonology and cardiology input appreciated. Suspect pulmonary hypertension 1 or 3. Indicate that she is unlikely to derive much benefit from pulmonary vasodilators. Treatment at this point will be home oxygen  and CPAP. Pulmonology suspects that her pulmonary artery hypertension is multifactorial due to a combination of OSA, COPD, elevated wedge pressure from left heart failure. 4. OSA: The patient was placed on AutoSet CPAP in the hospital. Case management to assess situation with [facility] >reportedly owes $500 balance before she can obtain machine. Patient noncompliant in house with CPAP-doesn't like masks. 5. Severe COPD: based upon PFTs performed this admission. As per pulmonology, PFTs done during this admission aren't reliable due to variable effort. Discharge regimen plan >Brio, Incruse & PRN Albuterol 6.  Multiple lung nodules: Being worked up by [facility]) for autoimmune disease. ANA 1:160 speckled, she has arthritis, rheumatoid factor < 8.6 on 06/22/16. HIV negative. History of MI lobectomy/VATS in 2013 with benign findings. Workup pending i.e. Aspergillus antigen, cryptococcal antigen, urine histoplasma and Blastomyces. Outpatient follow-up with pulmonology. 7. Tobacco abuse: Quit 07/2016.  8. PUD: Patient reported having EGD 8 months back showing gastric ulcer. PPI and sucralfate. Will need outpatient follow-up for repeat EGD. 9. Uncontrolled DM 2: CBGs ranging in the 340-380 range today. Adjust [name] and NovoLog. A1c: 10.9 suggesting poor outpatient control. 10. Essential hypertension: Controlled. Continue lisinopril and carvedilol 11. Hyperlipidemia: Continue simvastatin    DVT prophylaxis: Lovenox subQ Code Status: full code  Family Communication: No family at the bedside Disposition Plan: Adjust insulins for improved diabetic control. Possible DC home in 24 hours.   Consultants:   Cardiology  Pulmonary  Procedures:   2 D ECHO  Cardiac cath  10/19/2016   Antimicrobials:   None    Subjective: Indicates that her dyspnea is back to baseline. Denies any other complaints.  Objective: Vitals:   10/21/16 0757 10/21/16 0802 10/21/16 1157 10/21/16 1217  BP:   94/62   Pulse:   94   Resp:   18   Temp:   97.3 F (36.3 C)   TempSrc:   Oral   SpO2: 95% 97% 93% 95%  Weight:      Height:        Intake/Output Summary (Last 24 hours) at 10/21/16 1539 Last data filed at 10/21/16 1205  Gross per 24 hour  Intake              223 ml  Output              250 ml  Net              -27 ml   Filed Weights   10/19/16 0523 10/20/16 0229 10/21/16 0420  Weight: 104.9 kg (231 lb 3.2 oz) 104.3 kg (230 lb) 105.4 kg (232 lb 6.4 oz)    Examination:  General exam: Appears calm and comfortable, no distress  Respiratory system: Clear to auscultation except occasional basal crackles. No increased work of breathing.  Cardiovascular system: S1 & S2 heard, Rate controlled. No JVD or pedal edema. Telemetry: Sinus rhythm  Gastrointestinal system: (+) BS, non tender abdomen  Central nervous system: No focal neurological deficits. Extremities: No swelling, palpable pulses  Skin: Skin is warm and dry  Psychiatry: Normal mood and behavior   Data Reviewed: I have personally reviewed following labs and imaging studies  CBC:  Recent Labs Lab 10/19/16 0452 10/20/16 0429 10/21/16 0218  WBC 8.9 9.8 9.2  HGB 13.6 14.4 13.6  HCT 45.6 48.1* 45.8  MCV 88.0 88.4 86.9  PLT 281 306 536   Basic Metabolic Panel:  Recent Labs Lab 10/17/16 1621 10/18/16 0444 10/19/16 0452 10/20/16 0429 10/21/16 0218  NA 134* 137 138 135 135  K 4.6 4.1 4.6 4.5 4.9  CL 96* 98* 100* 95* 98*  CO2 '28 28 29 30 27  '$ GLUCOSE 420* 408* 338* 344* 254*  BUN 22* 20 23* 25* 22*  CREATININE 1.01* 1.01* 0.80 0.89 0.84  CALCIUM 9.7 9.4 9.7 9.6 9.5   GFR: Estimated Creatinine Clearance: 91.2 mL/min (by C-G formula based on SCr of 0.84 mg/dL). Liver Function Tests: No results for input(s): AST, ALT, ALKPHOS, BILITOT, PROT, ALBUMIN in the last 168 hours. No results for input(s): LIPASE, AMYLASE in the last 168 hours. No results for input(s): AMMONIA in the last 168 hours. Coagulation  Profile: No results for input(s): INR, PROTIME in the last 168 hours. Cardiac Enzymes:  Recent Labs Lab 10/14/16 1832  TROPONINI 0.03*   BNP (last 3 results) No results for input(s): PROBNP in the last 8760 hours. HbA1C:  Recent Labs  10/19/16 0452  HGBA1C 10.9*   CBG:  Recent Labs Lab 10/20/16 1247 10/20/16 1643 10/20/16 2027 10/21/16 0658 10/21/16 1156  GLUCAP 242* 196* 266* 380* 340*   Lipid Profile: No results for input(s): CHOL, HDL, LDLCALC, TRIG, CHOLHDL, LDLDIRECT in the last 72 hours. Thyroid Function Tests: No results for input(s): TSH, T4TOTAL, FREET4, T3FREE, THYROIDAB in the last 72 hours. Anemia Panel: No results for input(s): VITAMINB12, FOLATE, FERRITIN, TIBC, IRON, RETICCTPCT in the last 72 hours. Urine analysis:    Component Value Date/Time   COLORURINE YELLOW 10/13/2016 0002   APPEARANCEUR CLEAR 10/13/2016 0002   LABSPEC 1.010 10/13/2016 0002   PHURINE 5.5 10/13/2016 0002   GLUCOSEU NEGATIVE 10/13/2016 0002   HGBUR NEGATIVE 10/13/2016 0002   BILIRUBINUR NEGATIVE 10/13/2016 0002   KETONESUR NEGATIVE 10/13/2016 0002   PROTEINUR  NEGATIVE 10/13/2016 0002   NITRITE NEGATIVE 10/13/2016 0002   LEUKOCYTESUR NEGATIVE 10/13/2016 0002   Sepsis Labs: '@LABRCNTIP'$ (procalcitonin:4,lacticidven:4)   Recent Results (from the past 240 hour(s))  MRSA PCR Screening     Status: None   Collection Time: 10/14/16  1:55 AM  Result Value Ref Range Status   MRSA by PCR NEGATIVE NEGATIVE Final    Comment:        The GeneXpert MRSA Assay (FDA approved for NASAL specimens only), is one component of a comprehensive MRSA colonization surveillance program. It is not intended to diagnose MRSA infection nor to guide or monitor treatment for MRSA infections.       Radiology Studies: No results found.    Scheduled Meds: . aspirin EC  81 mg Oral Daily  . budesonide (PULMICORT) nebulizer solution  0.5 mg Nebulization BID  . carvedilol  3.125 mg Oral BID WC    . enoxaparin (LOVENOX) injection  40 mg Subcutaneous Q24H  . furosemide  40 mg Oral Daily  . insulin aspart  0-15 Units Subcutaneous TID WC  . insulin aspart  25 Units Subcutaneous TID WC  . insulin detemir  55 Units Subcutaneous BID  . ipratropium-albuterol  3 mL Nebulization QID  . linaclotide  290 mcg Oral QAC breakfast  . lisinopril  5 mg Oral Daily  . pantoprazole  40 mg Oral Daily  . [START ON 10/22/2016] potassium chloride  20 mEq Oral Daily  . simvastatin  40 mg Oral Daily  . sodium chloride flush  3 mL Intravenous Q12H  . sodium chloride flush  3 mL Intravenous Q12H  . sodium chloride flush  3 mL Intravenous Q12H  . sucralfate  1 g Oral TID   Continuous Infusions:    LOS: 7 days     Josalin Carneiro, MD, FACP, FHM. [facility] Pager [phone]  If 7PM-7AM, please contact night-coverage [email or web address] Password [email or web address] 10/21/2016, 3:57 PM

## 2016-10-21 NOTE — Progress Notes (Signed)
CPAP pulled from pts. room, been refusing, last used 10/29, unable to tolerate, no use at home.

## 2016-10-21 NOTE — Progress Notes (Signed)
Inpatient Diabetes Program Recommendations  AACE/ADA: New Consensus Statement on Inpatient Glycemic Control (2015)  Target Ranges:  Prepandial:   less than 140 mg/dL      Peak postprandial:   less than 180 mg/dL (1-2 hours)      Critically ill patients:  140 - 180 mg/dL   Results for Nancy Castillo, Nancy Castillo (MRN [id number]) as of 10/21/2016 12:48  Ref. Range 10/19/2016 04:52  Hemoglobin A1C Latest Ref Range: 4.8 - 5.6 % 10.9 (H)   Results for Nancy Castillo, Nancy Castillo (MRN [id number]) as of 10/21/2016 12:48  Ref. Range 10/20/2016 05:56 10/20/2016 12:47 10/20/2016 16:43 10/20/2016 20:27 10/21/2016 06:58 10/21/2016 11:56  Glucose-Capillary Latest Ref Range: 65 - 99 mg/dL 339 (H) 242 (H) 196 (H) 266 (H) 380 (H) 340 (H)   Review of Glycemic Control  Diabetes history: DM2 Outpatient Diabetes medications: [name] 50 BID + Humalog 20 BID + [name] 50/1000 QD Current orders for Inpatient glycemic control: [name] 55 units bid+ Novolog 25 units TIDAC meals+ Novolog correction 0-15 tid with meals  Inpatient Diabetes Program Recommendations:  Please consider:  Increasing [name] to 60 units BID and adding Novolog 0-5 units QHS to lower fasting CBG's  Thank you,  Windy Carina, RN, BSN Diabetes Coordinator Inpatient Diabetes Program [phone] (Team Pager)

## 2016-10-21 NOTE — Progress Notes (Signed)
Patient ID: Nancy Castillo, female   DOB: October 30, 1956, 60 y.o.   MRN: [id number]   SUBJECTIVE: Feels like she's back to baseline in terms of breathing, now on po Lasix.   RHC (10/30):  RA mean 16 RV 64/18 PA 61/25, mean 40 PCWP mean 18 Oxygen saturations: PA 52% AO 92% Cardiac Output (Fick) 3.87  Cardiac Index (Fick) 1.8  PVR 5.7 WU  PFTs (10/31): Severe obstructive airways disease.   Scheduled Meds: . aspirin EC  81 mg Oral Daily  . budesonide (PULMICORT) nebulizer solution  0.5 mg Nebulization BID  . carvedilol  3.125 mg Oral BID WC  . enoxaparin (LOVENOX) injection  40 mg Subcutaneous Q24H  . furosemide  40 mg Oral Daily  . insulin aspart  0-15 Units Subcutaneous TID WC  . insulin aspart  25 Units Subcutaneous TID WC  . insulin detemir  55 Units Subcutaneous BID  . ipratropium-albuterol  3 mL Nebulization QID  . linaclotide  290 mcg Oral QAC breakfast  . lisinopril  5 mg Oral Daily  . pantoprazole  40 mg Oral Daily  . potassium chloride  40 mEq Oral Daily  . simvastatin  40 mg Oral Daily  . sodium chloride flush  3 mL Intravenous Q12H  . sodium chloride flush  3 mL Intravenous Q12H  . sodium chloride flush  3 mL Intravenous Q12H  . sucralfate  1 g Oral TID   Continuous Infusions:  PRN Meds:.sodium chloride, sodium chloride, acetaminophen **OR** acetaminophen, acetaminophen, ipratropium-albuterol, lactulose, menthol-cetylpyridinium, ondansetron **OR** ondansetron (ZOFRAN) IV, ondansetron (ZOFRAN) IV, oxyCODONE-acetaminophen, phenol, polyethylene glycol, simethicone, sodium chloride flush, sodium chloride flush    Vitals:   10/20/16 2029 10/21/16 0420 10/21/16 0757 10/21/16 0802  BP: 101/75 117/63    Pulse: (!) 104 (!) 101    Resp: 18 17    Temp: 98.4 F (36.9 C) 97.7 F (36.5 C)    TempSrc: Oral Oral    SpO2: 90% 97% 95% 97%  Weight:  232 lb 6.4 oz (105.4 kg)    Height:        Intake/Output Summary (Last 24 hours) at 10/21/16 1012 Last data filed at 10/21/16  0913  Gross per 24 hour  Intake              223 ml  Output                0 ml  Net              223 ml    LABS: Basic Metabolic Panel:  Recent Labs  10/20/16 0429 10/21/16 0218  NA 135 135  K 4.5 4.9  CL 95* 98*  CO2 30 27  GLUCOSE 344* 254*  BUN 25* 22*  CREATININE 0.89 0.84  CALCIUM 9.6 9.5   Liver Function Tests: No results for input(s): AST, ALT, ALKPHOS, BILITOT, PROT, ALBUMIN in the last 72 hours. No results for input(s): LIPASE, AMYLASE in the last 72 hours. CBC:  Recent Labs  10/20/16 0429 10/21/16 0218  WBC 9.8 9.2  HGB 14.4 13.6  HCT 48.1* 45.8  MCV 88.4 86.9  PLT 306 314   Cardiac Enzymes: No results for input(s): CKTOTAL, CKMB, CKMBINDEX, TROPONINI in the last 72 hours. BNP: Invalid input(s): POCBNP D-Dimer: No results for input(s): DDIMER in the last 72 hours. Hemoglobin A1C:  Recent Labs  10/19/16 0452  HGBA1C 10.9*   Fasting Lipid Panel: No results for input(s): CHOL, HDL, LDLCALC, TRIG, CHOLHDL, LDLDIRECT in the last 72 hours. Thyroid  Function Tests: No results for input(s): TSH, T4TOTAL, T3FREE, THYROIDAB in the last 72 hours.  Invalid input(s): FREET3 Anemia Panel: No results for input(s): VITAMINB12, FOLATE, FERRITIN, TIBC, IRON, RETICCTPCT in the last 72 hours.  RADIOLOGY: Dg Abd 1 View  Result Date: 10/14/2016 CLINICAL DATA:  Bilateral abdominal pain EXAM: ABDOMEN - 1 VIEW COMPARISON:  10/03/2016, 10/02/2016 FINDINGS: Nonspecific nonobstructed gas pattern. No pathologic calcifications. IMPRESSION: Nonspecific nonobstructed gas pattern Electronically Signed   By: Donavan Foil M.D.   On: 10/14/2016 01:57   Ct Angio Chest Pe W And/or Wo Contrast  Result Date: 10/13/2016 CLINICAL DATA:  Initial evaluation for increased oxygen demand, hypoxia. EXAM: CT ANGIOGRAPHY CHEST WITH CONTRAST TECHNIQUE: Multidetector CT imaging of the chest was performed using the standard protocol during bolus administration of intravenous contrast.  Multiplanar CT image reconstructions and MIPs were obtained to evaluate the vascular anatomy. CONTRAST:  90 cc of Isovue 370. COMPARISON:  Prior CT from 09/20/2016 as well as previous radiograph from earlier same day. FINDINGS: Cardiovascular: Intrathoracic aorta a grossly normal and of normal caliber. No definite aortic abnormality, although evaluation limited due to timing of the contrast bolus. Cardiomegaly is stable from prior. Right ventricular and right atrial enlargement with straightening of the intraventricular septum. No pericardial effusion. Pulmonary arterial tree adequately opacified for evaluation. Main pulmonary artery mildly dilated to 3.2 cm and transaxial diameter. No filling defect to suggest acute pulmonary embolism identified. Re-formatted imaging confirms these findings. From the data bowing for possible small distal emboli somewhat limited on CT moved respiratory motion artifact and body habitus. Mediastinum/Nodes: Visualized thyroid is normal. Scattered enlarged mediastinal lymph nodes measure up to approximately 2 cm in short access, not significantly changed relative to recent CT. Prevascular node measures 15 mm, stable. Right hilar node measures 14 mm. Soft tissue fullness at the left hilum with 16 mm left hilar node also similar. Mildly prominent left axillary node measures up to 11 mm, similar to previous. No right axillary adenopathy. Esophagus within normal limits. Lungs/Pleura: Small layering bilateral pleural effusions. Diffuse interlobular septal thickening suggestive of pulmonary edema. 5 mm right upper lobe nodule is stable (series 6, image 32). Additional 10 x 9 mm nodular density within the posterior left upper lobe also stable (series 6, image 34). Unchanged 5 mm subpleural left upper lobe nodule (series 6, image 42). No focal infiltrates. Linear atelectasis within the right middle lobe and lingula. No pneumothorax. Upper Abdomen: Visualized upper abdomen within normal limits.  Musculoskeletal: No acute osseous abnormality. No worrisome lytic or blastic osseous lesions. Review of the MIP images confirms the above findings. IMPRESSION: 1. No CT evidence for acute pulmonary embolism. 2. Diffuse interlobular septal thickening, suggesting mild pulmonary interstitial edema. Small layering bilateral pleural effusions. 3. Dilatation of the main pulmonary artery to 3.2 cm, suggesting pulmonary hypertension. Right ventricular and right atrial dilatation with mild straightening of the intraventricular septum, suggestive of elevated right sided heart pressures. Findings are similar relative to recent CT. 4. Similar appearance of enlarged mediastinal, hilar, and left axillary lymph nodes as above. Again, while these findings may be related to underlying systemic disease such as sarcoidosis, possible lymphoproliferative disorder could also have this appearance. 5. Bilateral pulmonary nodules, stable relative to previous examinations, presumably benign. Electronically Signed   By: Jeannine Boga M.D.   On: 10/13/2016 22:24   Dg Chest Portable 1 View  Result Date: 10/13/2016 CLINICAL DATA:  60 year old female with history of acute on chronic shortness of breath today. Low oxygen saturations. EXAM:  PORTABLE CHEST 1 VIEW COMPARISON:  Chest x-ray 09/30/2016. FINDINGS: There is cephalization of the pulmonary vasculature and slight indistinctness of the interstitial markings suggestive of mild pulmonary edema. Mild cardiomegaly trace bilateral pleural effusions. The patient is rotated to the right on today's exam, resulting in distortion of the mediastinal contours and reduced diagnostic sensitivity and specificity for mediastinal pathology. Atherosclerosis in the thoracic aorta. IMPRESSION: 1. The appearance of the chest suggests mild congestive heart failure, as above. Electronically Signed   By: Vinnie Langton M.D.   On: 10/13/2016 17:44    PHYSICAL EXAM General: NAD Neck: Thick,  difficult to assess but JVP now appears normal, no thyromegaly or thyroid nodule.  Lungs: Distant breath sounds CV: Nondisplaced PMI.  Heart regular S1/S2, no S3/S4, no murmur.  No peripheral edema.   Abdomen: Soft, nontender, no hepatosplenomegaly, no distention.  Neurologic: Alert and oriented x 3.  Psych: Normal affect. Extremities: No clubbing or cyanosis.   TELEMETRY: Reviewed telemetry pt in NSR  ASSESSMENT AND PLAN: 60 yo with history of COPD/chronic hypoxic respiratory failure on 2 L home oxygen, OSA but not using CPAP, CAD s/p RCA PCI, and diastolic CHF/cor pulmonale presented with acute on chronic diastolic CHF. 1. Acute on chronic diastolic CHF with cor pulmonale: Echo (10/17) with EF 50-55%, flattened interventricular septum, RV mildly dilated and severely decreased systolic function, no PA pressure estimate. CTA chest with no PE but did show some pulmonary edema.  RHC as above.  She has diuresed well overall, volume status looks ok now. - Continue Lasix 40 mg po daily for home, was not on Lasix prior to admission.    2. Pulmonary hypertension: Moderate pulmonary arterial HTN with elevated PVR on RHC 10/30 with RV failure on echo.  PFTs showed severe COPD, no definite interstitial lung disease or PE on CT of chest.   Autoimmune workup ordered.  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  Unlikely to derive much benefit from pulmonary vasodilators.  Treatment at this point will be home oxygen and CPAP.  3. Chronic hypoxemic respiratory failure: Has been on home oxygen.  Severe COPD by PFTs.  4. OSA: Not on CPAP at home but should be.  Use CPAP in hospital.   [name] 10/21/2016 10:12 AM

## 2016-10-21 NOTE — Progress Notes (Signed)
Name: Nancy Castillo MRN: [id number] DOB: August 30, 1956    ADMISSION DATE:  10/13/2016 CONSULTATION DATE:  10/16/16  REFERRING MD :  Dr. Clementeen Graham   CHIEF COMPLAINT:  SOB    HISTORY OF PRESENT ILLNESS:  60 y/o F, former smoker (quit August 2017) with PMH of DM, HTN, CAD s/p stent to RCA, OSA - unable to get CPAP due to financial balance, COPD, chronic hypoxic respiratory failure on 3.5-4L at baseline (followed by Dr. [name] at [facility]), pulmonary hypertension, pulmonary nodules (ANA is 1:160 speckled, she has arthritis per Dr. [name]) and prior lobectomy [facility], Dr. Lianne Moris, RML lobectomy 2013) who was admitted on 10/24 with what she describes as "abdominal pain".  She reports her baseline is essentially lying in bed all day.  She "does not get up for much due to shortness of breath and feeling tired".  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   On a "good day" she mostly lays in bed because when she gets up she gets short of breath with activity and has "stomach pain".  She feels tired all the time.    The patient presented to [facility] on 10/24 with "abdominal pain".  When asked to point out where she hurts, she points to her left chest and mid-epigastric area.  She denies fevers, chills, chest pain, pain with inspiration.  The patient was found to be hypoxic on admit requiring increased O2.  She denies current issues with her breathing. The patient just participated with PT and was able to ambulate without difficulty.  Her observed limiting factor is shortness of breath.  She reports abdominal bloating and LE swelling at times.  She reports she is on multiple pulmonary agents > some overlapping from recent discharge at [facility].  Currently using PRN albuterol, Breo, Advair and incruse.  However, she was unable to get Incruse due to no prescription. She does not wear CPAP due to financial issues.   Of note, she was last seen at [facility] clinic and weight noted to be 230lbs.    PCCM consulted 10/27 for acute on chronic  hypoxic respiratory failure.    SUBJECTIVE: Pt reports she did not wear CPAP overnight.  Reports she his having a hard time getting her breath with CPAP in place.  "Hates" full face mask, doesn't like nasal mask.    VITAL SIGNS: Temp:  [97.7 F (36.5 C)-98.4 F (36.9 C)] 97.7 F (36.5 C) (11/01 0420) Pulse Rate:  [90-104] 101 (11/01 0420) Resp:  [17-18] 17 (11/01 0420) BP: (101-117)/(52-75) 117/63 (11/01 0420) SpO2:  [90 %-97 %] 97 % (11/01 0802) Weight:  [232 lb 6.4 oz (105.4 kg)] 232 lb 6.4 oz (105.4 kg) (11/01 0420)  PHYSICAL EXAMINATION: General:  Morbidly obese female in NAD  Neuro:  AAOx4, speech clear, MAE, good strength PSY: pleasant / cooperative  HEENT:  MM pink/moist, no jvd Cardiovascular:  s1s2 rrr, no m/r/g Lungs:  Even/non-labored, lungs bilaterally diminished but clear  Abdomen:  Obese/soft, bsx4 active  Musculoskeletal:  No acute deformities  Skin:  Warm/dry, no edema    Recent Labs Lab 10/19/16 0452 10/20/16 0429 10/21/16 0218  NA 138 135 135  K 4.6 4.5 4.9  CL 100* 95* 98*  CO2 29 30 27   BUN 23* 25* 22*  CREATININE 0.80 0.89 0.84  GLUCOSE 338* 344* 254*    Recent Labs Lab 10/19/16 0452 10/20/16 0429 10/21/16 0218  HGB 13.6 14.4 13.6  HCT 45.6 48.1* 45.8  WBC 8.9 9.8 9.2  PLT 281 306 314  No results found.   SIGNIFICANT EVENTS  10/24  Admit to [facility] with complaints of ABD pain, found to be hypoxic 10/27  PCCM consulted with acute on chronic hypoxic respiratory failure   STUDIES:  CT ABD/Pelvis 10/02/16 >> focal area of low attenuation within the inferior aspect of the liver, may represent evolution of a subacute to chronic hematoma, edematous gallbladder likely due to underlying liver disease, fibroid uterus  RUQ [place] 10/14 /17 >> normal gallbladder, wall thickening on CT may have been due to gallbladder contraction, ? Resolving liver laceration CTA Chest 09/30/16 >> no PE, dilation of the pulmonic trunk, suggestive of PAH, cardiomegaly  with concerns of elevated R heart pressures, mild pulmonary edema, worsening LAN in the mediastinal & hilar nodes, all pulmonary nodules stable compared to prior exams (read by Madie Reno) ECHO 10/26 >> low normal to mildy reduced LV systolic function, LVEF 34%, grade 1 diastolic dysfunction, D shaped septum, severely reduced RV function  RHC 10/31  RA mean 16 RV 64/18 PA 61/25, mean 40 PCWP mean 18 Oxygen saturations: PA 52% AO 92% Cardiac Output (Fick) 3.87  Cardiac Index (Fick) 1.8  PVR 5.7 WU  AUTOIMMUNE 10/31  RF >> less than 10  CCP >>  SSA >>  SSB >>  ESR >> 14 CRP >> 1.5 ANA >>  Anti-Smith >>  DS DNA >>  Anti-scleroderma >>  HSP >>  Cryptococcal Antigen >>  Beta-D Glucan >>   ASSESSMENT / PLAN:  Acute on Chronic Hypoxic Respiratory Failure - in the setting of moderate pulmonary hypertension, COPD, ongoing tobacco abuse, CAD, non-compliance with CPAP. Baseline hypercapnia.  She often increases O2 up to 6L at home. Doubt she is far from her baseline.  Hx of mold exposure at home.   Plan: O2 goal 90% Pulmonary hygiene - IS, mobilize  PT Intermittent CXR Await HSP panel as above  Moderate Pulmonary Hypertension - appreciate cardiology assistance, RHC findings noted as above.  Possible PAH 1 or 3.    Plan: PFT's not helpful as patient was unable to complete Will wait to see autoimmune panel to help determine if she would be a candidate for pulmonary vasodilators Lasix per CHF Service  Monitor I/O's   Severe COPD - unable to find baseline PFT's but per notes at [facility] > spirometry shows severe obstruction with low FVC.    Plan: Discharge regimen plan > Breo, Incruse and PRN albuterol.  She will need prescriptions for above at discharge  Follow up with Dr. [name] at [facility] at discharge  Budesonide BID Continue duonebs  OSA - noncompliant with CPAP due to financial issues   Plan: Autoset CPAP QHS  CM to assess situation with [facility] > reportedly owes 500$  balance before she can obtain machine Pt not compliant in house with CPAP > doesn't like masks  Pulmonary Nodules - worked up by [facility] for autoimmune disease, ANA is 1:160 speckled, she has arthritis, RF <8.6 06/22/16.  HIV negative.  Hx of RML lobectomy / VATS in 2013 with benign findings  Plan: Monitor for now ? Underlying autoimmune process contributing to pulmonary nodules Await autoimmune panel as above  Recent Tobacco Abuse - quit 07/2016 per report   Plan: Smoking cessation counseling  Husband counseled as well, reports he quit as well  Noe Gens, NP-C [facility] Pgr: [phone] or if no answer [phone] 10/21/2016, 9:18 AM  Attending note: I have seen and examined the patient with nurse practitioner/resident and agree with the note.  History, labs and imaging reviewed.  Ongoing evaluation for pulmonary hypertension. Possible PAH 1 or 3 Her elevated PA pressures are likely multifactorial with a combination of sleep apnea, COPD, elevated wedge pressure from left heart failure. Pulmonary arterial hypertension also possible five the elevated PVR of 5/7 PFTs done during this admission are unreliable due to variable effort however they seemed to suggest severe COPD.  We are awaiting completion of a repeat autoimmune workup as her previous ANA was borderline positive. Given her multiple lung nodule we have sent off Aspergillus antigen, cryptococcal antigen, urine histoplasma, blastomyces. Suggest optimization of her underlying COPD, sleep apnea and checking the final results of the above studies before considering pulmonary vasodilators. We will follow up in clinic. PCCM will be available. Please call with any questions.   Marshell Garfinkel MD [facility] Pager [phone] If no answer or after 3pm call: [phone] 10/21/2016, 2:45 PM

## 2016-10-22 DIAGNOSIS — E119 Type 2 diabetes mellitus without complications: Secondary | ICD-10-CM

## 2016-10-22 LAB — BASIC METABOLIC PANEL
Anion gap: 9 (ref 5–15)
BUN: 14 mg/dL (ref 6–20)
CHLORIDE: 102 mmol/L (ref 101–111)
CO2: 27 mmol/L (ref 22–32)
CREATININE: 0.78 mg/dL (ref 0.44–1.00)
Calcium: 9.4 mg/dL (ref 8.9–10.3)
GFR calc Af Amer: >60 mL/min (ref >60)
GLUCOSE: 195 mg/dL — AB (ref 65–99)
POTASSIUM: 4 mmol/L (ref 3.5–5.1)
Sodium: 138 mmol/L (ref 135–145)

## 2016-10-22 LAB — GLUCOSE, CAPILLARY
GLUCOSE-CAPILLARY: 240 mg/dL — AB (ref 65–99)
Glucose-Capillary: 197 mg/dL — ABNORMAL HIGH (ref 65–99)
Glucose-Capillary: 218 mg/dL — ABNORMAL HIGH (ref 65–99)

## 2016-10-22 MED ORDER — POTASSIUM CHLORIDE CRYS ER 20 MEQ PO TBCR: 20.0000 meq | EXTENDED_RELEASE_TABLET | Freq: Every day | ORAL | 0 refills | Status: AC

## 2016-10-22 MED ORDER — INSULIN LISPRO 100 UNIT/ML (KWIKPEN): 25.0000 [IU] | PEN_INJECTOR | Freq: Three times a day (TID) | SUBCUTANEOUS | Status: AC

## 2016-10-22 MED ORDER — UMECLIDINIUM BROMIDE 62.5 MCG/INH IN AEPB: 1.0000 | INHALATION_SPRAY | Freq: Every day | RESPIRATORY_TRACT | 0 refills | Status: AC

## 2016-10-22 MED ORDER — IPRATROPIUM-ALBUTEROL 0.5-2.5 (3) MG/3ML IN SOLN: 3.0000 mL | Freq: Three times a day (TID) | RESPIRATORY_TRACT | Status: DC

## 2016-10-22 MED ORDER — FUROSEMIDE 20 MG PO TABS: 40.0000 mg | ORAL_TABLET | Freq: Every day | ORAL | 0 refills | Status: AC

## 2016-10-22 MED ORDER — INSULIN DETEMIR 100 UNIT/ML FLEXPEN: 60.0000 [IU] | PEN_INJECTOR | Freq: Two times a day (BID) | SUBCUTANEOUS | Status: AC

## 2016-10-22 MED ORDER — FLUTICASONE-SALMETEROL 250-50 MCG/DOSE IN AEPB: 1.0000 | INHALATION_SPRAY | Freq: Two times a day (BID) | RESPIRATORY_TRACT | 0 refills | Status: AC

## 2016-10-22 NOTE — Progress Notes (Signed)
Patient ID: Nancy Castillo, female   DOB: Mar 23, 1956, 60 y.o.   MRN: [id number]   SUBJECTIVE: Stable on po lasix.  Breathing "a little stifled" this am, but of note she did not wear her CPAP.    BMET pending.  Awaiting autoimmune work up per Pulmonary. Previous ANA borderline positive. ANA Negative as of 10/20/16.  RHC (10/30):  RA mean 16 RV 64/18 PA 61/25, mean 40 PCWP mean 18 Oxygen saturations: PA 52% AO 92% Cardiac Output (Fick) 3.87  Cardiac Index (Fick) 1.8  PVR 5.7 WU  PFTs (10/31): Severe obstructive airways disease.   Scheduled Meds: . aspirin EC  81 mg Oral Daily  . budesonide (PULMICORT) nebulizer solution  0.5 mg Nebulization BID  . carvedilol  3.125 mg Oral BID WC  . enoxaparin (LOVENOX) injection  40 mg Subcutaneous Q24H  . furosemide  40 mg Oral Daily  . insulin aspart  0-15 Units Subcutaneous TID WC  . insulin aspart  0-5 Units Subcutaneous QHS  . insulin aspart  25 Units Subcutaneous TID WC  . insulin detemir  60 Units Subcutaneous BID  . ipratropium-albuterol  3 mL Nebulization QID  . linaclotide  290 mcg Oral QAC breakfast  . lisinopril  5 mg Oral Daily  . pantoprazole  40 mg Oral Daily  . potassium chloride  20 mEq Oral Daily  . simvastatin  40 mg Oral Daily  . sodium chloride flush  3 mL Intravenous Q12H  . sodium chloride flush  3 mL Intravenous Q12H  . sodium chloride flush  3 mL Intravenous Q12H  . sucralfate  1 g Oral TID   Continuous Infusions:  PRN Meds:.sodium chloride, sodium chloride, acetaminophen **OR** acetaminophen, ipratropium-albuterol, lactulose, menthol-cetylpyridinium, ondansetron **OR** ondansetron (ZOFRAN) IV, oxyCODONE-acetaminophen, phenol, polyethylene glycol, simethicone, sodium chloride flush, sodium chloride flush    Vitals:   10/21/16 1217 10/21/16 1601 10/21/16 2039 10/21/16 2126  BP:    (!) 104/58  Pulse:    92  Resp:    17  Temp:    99 F (37.2 C)  TempSrc:    Oral  SpO2: 95% 93% 97% 92%  Weight:      Height:         Intake/Output Summary (Last 24 hours) at 10/22/16 0738 Last data filed at 10/22/16 0647  Gross per 24 hour  Intake              940 ml  Output             1000 ml  Net              -60 ml    LABS: Basic Metabolic Panel:  Recent Labs  10/20/16 0429 10/21/16 0218  NA 135 135  K 4.5 4.9  CL 95* 98*  CO2 30 27  GLUCOSE 344* 254*  BUN 25* 22*  CREATININE 0.89 0.84  CALCIUM 9.6 9.5   Liver Function Tests: No results for input(s): AST, ALT, ALKPHOS, BILITOT, PROT, ALBUMIN in the last 72 hours. No results for input(s): LIPASE, AMYLASE in the last 72 hours. CBC:  Recent Labs  10/20/16 0429 10/21/16 0218  WBC 9.8 9.2  HGB 14.4 13.6  HCT 48.1* 45.8  MCV 88.4 86.9  PLT 306 314   Cardiac Enzymes: No results for input(s): CKTOTAL, CKMB, CKMBINDEX, TROPONINI in the last 72 hours. BNP: Invalid input(s): POCBNP D-Dimer: No results for input(s): DDIMER in the last 72 hours. Hemoglobin A1C: No results for input(s): HGBA1C in the last 72 hours. Fasting  Lipid Panel: No results for input(s): CHOL, HDL, LDLCALC, TRIG, CHOLHDL, LDLDIRECT in the last 72 hours. Thyroid Function Tests: No results for input(s): TSH, T4TOTAL, T3FREE, THYROIDAB in the last 72 hours.  Invalid input(s): FREET3 Anemia Panel: No results for input(s): VITAMINB12, FOLATE, FERRITIN, TIBC, IRON, RETICCTPCT in the last 72 hours.  RADIOLOGY: Dg Abd 1 View  Result Date: 10/14/2016 CLINICAL DATA:  Bilateral abdominal pain EXAM: ABDOMEN - 1 VIEW COMPARISON:  10/03/2016, 10/02/2016 FINDINGS: Nonspecific nonobstructed gas pattern. No pathologic calcifications. IMPRESSION: Nonspecific nonobstructed gas pattern Electronically Signed   By: Donavan Foil M.D.   On: 10/14/2016 01:57   Ct Angio Chest Pe W And/or Wo Contrast  Result Date: 10/13/2016 CLINICAL DATA:  Initial evaluation for increased oxygen demand, hypoxia. EXAM: CT ANGIOGRAPHY CHEST WITH CONTRAST TECHNIQUE: Multidetector CT imaging of the chest  was performed using the standard protocol during bolus administration of intravenous contrast. Multiplanar CT image reconstructions and MIPs were obtained to evaluate the vascular anatomy. CONTRAST:  90 cc of Isovue 370. COMPARISON:  Prior CT from 09/20/2016 as well as previous radiograph from earlier same day. FINDINGS: Cardiovascular: Intrathoracic aorta a grossly normal and of normal caliber. No definite aortic abnormality, although evaluation limited due to timing of the contrast bolus. Cardiomegaly is stable from prior. Right ventricular and right atrial enlargement with straightening of the intraventricular septum. No pericardial effusion. Pulmonary arterial tree adequately opacified for evaluation. Main pulmonary artery mildly dilated to 3.2 cm and transaxial diameter. No filling defect to suggest acute pulmonary embolism identified. Re-formatted imaging confirms these findings. From the data bowing for possible small distal emboli somewhat limited on CT moved respiratory motion artifact and body habitus. Mediastinum/Nodes: Visualized thyroid is normal. Scattered enlarged mediastinal lymph nodes measure up to approximately 2 cm in short access, not significantly changed relative to recent CT. Prevascular node measures 15 mm, stable. Right hilar node measures 14 mm. Soft tissue fullness at the left hilum with 16 mm left hilar node also similar. Mildly prominent left axillary node measures up to 11 mm, similar to previous. No right axillary adenopathy. Esophagus within normal limits. Lungs/Pleura: Small layering bilateral pleural effusions. Diffuse interlobular septal thickening suggestive of pulmonary edema. 5 mm right upper lobe nodule is stable (series 6, image 32). Additional 10 x 9 mm nodular density within the posterior left upper lobe also stable (series 6, image 34). Unchanged 5 mm subpleural left upper lobe nodule (series 6, image 42). No focal infiltrates. Linear atelectasis within the right middle  lobe and lingula. No pneumothorax. Upper Abdomen: Visualized upper abdomen within normal limits. Musculoskeletal: No acute osseous abnormality. No worrisome lytic or blastic osseous lesions. Review of the MIP images confirms the above findings. IMPRESSION: 1. No CT evidence for acute pulmonary embolism. 2. Diffuse interlobular septal thickening, suggesting mild pulmonary interstitial edema. Small layering bilateral pleural effusions. 3. Dilatation of the main pulmonary artery to 3.2 cm, suggesting pulmonary hypertension. Right ventricular and right atrial dilatation with mild straightening of the intraventricular septum, suggestive of elevated right sided heart pressures. Findings are similar relative to recent CT. 4. Similar appearance of enlarged mediastinal, hilar, and left axillary lymph nodes as above. Again, while these findings may be related to underlying systemic disease such as sarcoidosis, possible lymphoproliferative disorder could also have this appearance. 5. Bilateral pulmonary nodules, stable relative to previous examinations, presumably benign. Electronically Signed   By: Jeannine Boga M.D.   On: 10/13/2016 22:24   Dg Chest Portable 1 View  Result Date: 10/13/2016 CLINICAL  DATA:  60 year old female with history of acute on chronic shortness of breath today. Low oxygen saturations. EXAM: PORTABLE CHEST 1 VIEW COMPARISON:  Chest x-ray 09/30/2016. FINDINGS: There is cephalization of the pulmonary vasculature and slight indistinctness of the interstitial markings suggestive of mild pulmonary edema. Mild cardiomegaly trace bilateral pleural effusions. The patient is rotated to the right on today's exam, resulting in distortion of the mediastinal contours and reduced diagnostic sensitivity and specificity for mediastinal pathology. Atherosclerosis in the thoracic aorta. IMPRESSION: 1. The appearance of the chest suggests mild congestive heart failure, as above. Electronically Signed   By:  Vinnie Langton M.D.   On: 10/13/2016 17:44    PHYSICAL EXAM General: NAD Neck: Thick, JVP difficult, but does not appear elevated. No thyromegaly or thyroid nodule appreciated.  Lungs: Distant and diminished, but otherwise clear.  CV: Nondisplaced PMI.  Heart regular S1/S2, no S3/S4, no murmur.  No peripheral edema.   Abdomen: Soft, NT, ND, no HSM. No bruits or masses. +BS  Neurologic: Alert and oriented x 3.  Psych: Normal affect. Extremities: No clubbing or cyanosis.   TELEMETRY: Reviewed, pt in NSR 90s   ASSESSMENT AND PLAN: 60 yo with history of COPD/chronic hypoxic respiratory failure on 2 L home oxygen, OSA but not using CPAP, CAD s/p RCA PCI, and diastolic CHF/cor pulmonale presented with acute on chronic diastolic CHF.  1. Acute on chronic diastolic CHF with cor pulmonale: Echo (10/17) with EF 50-55%, flattened interventricular septum, RV mildly dilated and severely decreased systolic function, no PA pressure estimate. CTA chest with no PE but did show some pulmonary edema.  RHC as above.  Overall negative 8.4 L as of this am. Volume status looks stable currently.  - Continue lasix 40 mg daily.  Will use this dose for home as long as BMET stable today.      2. Pulmonary hypertension: Moderate pulmonary arterial HTN with elevated PVR on RHC 10/30 with RV failure on echo.  PFTs showed severe COPD, no definite interstitial lung disease or PE on CT of chest.   Autoimmune workup ordered.  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 Likely to have only limited benefit from pulmonary vasodilators.  - Optimal treatment at this point is to continue home 02 and use CPAP.   3. Chronic hypoxemic respiratory failure:  - Continue home 02. Severe COPD by PFTs.  - Meds per Pulmonary.  4. OSA:  - Non-compliant with CPAP.  Working with pulmonary to optimize. (Can't tolerate mask and hates nasal mask). Stressed importance of compliance for both morbidity and mortality.   Nancy Friar, PA-C 10/22/2016 7:38 AM  Advanced Heart Failure Team Pager [phone] (M-F; 7a - 4p)  Please contact [facility] for night-coverage after hours (4p -7a ) and weekends on [email or web address]   Patient seen with PA, agree with the above note.  Continue po Lasix.  Continue to optimize CPAP.  Will arrange CHF clinic followup.    [name] 10/22/2016 1:56 PM

## 2016-10-22 NOTE — Progress Notes (Addendum)
Inpatient Diabetes Program Recommendations  AACE/ADA: New Consensus Statement on Inpatient Glycemic Control (2015)  Target Ranges:  Prepandial:   less than 140 mg/dL      Peak postprandial:   less than 180 mg/dL (1-2 hours)      Critically ill patients:  140 - 180 mg/dL   Lab Results  Component Value Date   GLUCAP 240 (H) 10/22/2016   HGBA1C 10.9 (H) 10/19/2016    Review of Glycemic Control Results for Nancy Castillo, Nancy Castillo (MRN [id number]) as of 10/22/2016 10:51  Ref. Range 10/21/2016 06:58 10/21/2016 11:56 10/21/2016 16:31 10/21/2016 22:53 10/22/2016 06:42  Glucose-Capillary Latest Ref Range: 65 - 99 mg/dL 380 (H) 340 (H) 269 (H) 169 (H) 240 (H)   Diabetes history:DM2 Outpatient Diabetes medications: [name] 50 BID + Humalog 20 BID + [name] 50/1000 QD Current orders for Inpatient glycemic control: [name] 60 units bid+ Novolog 25 units TIDAC meals+ Novolog correction 0-15 tid with meals  Inpatient Diabetes Program Recommendations: Noted elevated CBGs. Please consider increasing [name] to 65 units BID.  Spoke with pt @ bedside about A1C results 10.9 (average CBG 266) and explained what an A1C is, basic pathophysiology of DM Type 2, basic home care, basic diabetes diet nutrition principles, importance of checking CBGs and maintaining good CBG control to prevent long-term and short-term complications. Reviewed signs and symptoms of hyperglycemia and hypoglycemia and how to treat hypoglycemia at home. Also reviewed blood sugar goals at home.  RNs to provide ongoing basic DM education at bedside with this patient. .    Thank you, Nani Gasser. Sharmarke Cicio, RN, MSN, CDE Inpatient Glycemic Control Team Team Pager [phone] (8am-5pm) 10/22/2016 10:54 AM

## 2016-10-22 NOTE — Discharge Summary (Signed)
Physician Discharge Summary  Nancy Castillo QQV:[id number] DOB: 1956/07/08  PCP: [name], MD  Admit date: 10/13/2016 Discharge date: 10/22/2016   Recommendations for Outpatient Follow-up:  1. Dr. [name], PCP In 4 days with repeat labs (CBC & BMP). Recommend follow-up and adjusting diabetic medications as needed.Please follow up pending autoimmune workup that was sent from the hospital. 2. Dr. [name], Pulmonology in 1 week. MDs office will call patient with appointment. 3. Dr. [name], Cardiology on 11/03/16 at 10:30 AM.  Home Health: None Equipment/Devices: None    Discharge Condition: Improved and stable.  CODE STATUS: Full  Diet recommendation: Heart healthy and diabetic diet.  Discharge Diagnoses:  Principal Problem:   Acute on chronic respiratory failure with hypoxia (HCC) Active Problems:   PUD (peptic ulcer disease)   Abdominal pain   Acute exacerbation of chronic obstructive pulmonary disease (COPD) (HCC)   Acute diastolic CHF (congestive heart failure) (HCC)   Dyslipidemia associated with type 2 diabetes mellitus ([name])   Uncontrolled diabetes mellitus without complication, with long-term current use of insulin (HCC)   Benign essential HTN   RVF (right ventricular failure) (HCC)   Pulmonary hypertension   Acute cor pulmonale (HCC)   Moderate to severe pulmonary hypertension   COPD, severe (HCC)   Multiple lung nodules on CT   OSA (obstructive sleep apnea)   Brief/Interim Summary: 60 year old obese female with history of chronic hypoxic respiratory failure on home oxygen 4 L/m at rest and 6 L/m with activity, pulmonary hypertension, COPD, CAD, chronic abdominal pain with PUD, presented to the ED with epigastric pain and shortness of breath. She was recently hospitalized at [facility] for COPD exacerbation. CT angiogram of the chest was negative for PE but showed diffuse septal thickening suggestive of mild pulmonary interstitial edema  and pulmonary hypertension. Patient admitted for COPD exacerbation, acute diastolic CHF and pulmonary hypertension.   Assessment & Plan:  1. Acute on chronic respiratory failure with hypoxia: Multifactorial due to pulmonary hypertension, COPD, ongoing tobacco abuse, noncompliance with CPAP and decompensated CHF. Baseline hypercapnia.. Patient feels that her breathing is back to baseline. Apparently on 4 L/m oxygen at home but at times increases to 6 L/m. improved and stable. 2. Acute on chronic diastolic CHF with cor pulmonale: Cardiology follow-up appreciated. Echo 10/06/16: EF 50-55 percent, flattened intraventricular septum, RV mildly dilated and severely decreased systolic function, no PA pressure estimated. CTA chest: No PE but showed some pulmonary edema. RHC results as below. Clinically compensated. -8.5 L since admission. Discussed with Dr. [name] who recommended continuing current dose of Lasix 40 mg daily at discharge and has arranged for outpatient follow-up at the advanced heart failure clinic. 3. Pulmonary hypertension: moderate pulmonary artery hypertension by RHC 10/30 with RV failure on echo. PFT showed severe COPD. No definite interstitial lung disease or PE on CT chest. Follow-up autoimmune workup as outpatient. Pulmonology and cardiology input appreciated. Suspect pulmonary hypertension 1 or 3. They indicate that she is unlikely to derive much benefit from pulmonary vasodilators. Treatment at this point will be home oxygen and CPAP. Pulmonology suspects that her pulmonary artery hypertension is multifactorial due to a combination of OSA, COPD, elevated wedge pressure from left heart failure. 4. OSA: The patient was placed on AutoSet CPAP in the hospital. Case management assessed issues with CPAP noncompliance>reportedly owes $500 balance before she can obtain machine. Patient noncompliant in patient with CPAP-doesn't like masks. Case management unable to assist any further. 5. Severe  COPD: based upon PFTs performed this  admission. As per pulmonology, PFTs done during this admission aren't reliable due to variable effort. Initially treated for COPD exacerbation with steroids which may have contributed to worsening hyperglycemia. Exacerbation has clinically resolved. Discussed with pulmonology today who recommended COPD regimen as outlined below and follow up with her primary pulmonologist. 6.  Multiple lung nodules: Being worked up by [facility] (Dr. [name]) for autoimmune disease. ANA 1:160 speckled, she has arthritis, rheumatoid factor < 8.6 on 06/22/16. HIV negative. History RML lobectomy/VATS in 2013 with benign findings. Workup pending i.e. Aspergillus antigen, cryptococcal antigen, urine histoplasma and Blastomyces. Outpatient follow-up with pulmonology. Anticentromere B antibodies: Negative. Please see below for autoimmune workup results. Rest to be followed up as outpatient. 7. Tobacco abuse: Quit 07/2016.  8. PUD/abdominal pain: Patient reported having EGD 8 months back showing gastric ulcer. PPI and sucralfate. Will need outpatient follow-up for repeat EGD which was discussed with patient and she verbalized understanding. No further abdominal pain. 9. Uncontrolled DM 2: CBGs ranging in the 340-380 range on 11/1. Adjusted [name] and NovoLog. A1c: 10.9 suggesting poor outpatient control. Steroids may have contributed to some of her earlier hyperglycemia. Recommended compliance with all aspects of diabetic care and close outpatient follow-up with her PCP regarding further management. [name] discontinued secondary to heart failure. 10. Essential hypertension: Controlled. Continue lisinopril and carvedilol 11. Hyperlipidemia: Continue simvastatin 12. CAD status post stenting: Cardiology was consulted. She has history of CAD with prior right coronary stent (recently proved widely patent by angiography at [facility]) 13. Troponin elevation: Cardiology was consulted and  indicated that the trivially elevated troponin was secondary to demand and no workup was recommended. 14. Noncompliance issues: Seem to be a big factor in dealing with her multiple medical problems. Counseled regarding importance of compliance with all aspects of medical care and she verbalized understanding.    Consultants:   Cardiology  Pulmonary  Procedures:   10/19/16: Procedures   Right Heart Cath  Conclusion   1. Mildly elevated left heart filling pressure, significantly elevated right heart filling pressure.  2. Moderate pulmonary arterial hypertension with PVR 5.7 WU. 3. Decreased cardiac output, may not be completely accurate due to significantly fluctuating oxygen saturations during case.      2-D echo 10/15/16: Study Conclusions  - Left ventricle: The cavity size was normal. Wall thickness was   increased in a pattern of mild LVH. Systolic function was normal.   The estimated ejection fraction was in the range of 50% to 55%.   Wall motion was normal; there were no regional wall motion   abnormalities. Doppler parameters are consistent with abnormal   left ventricular relaxation (grade 1 diastolic dysfunction). - Ventricular septum: The contour showed diastolic flattening and   systolic flattening. - Right ventricle: The cavity size was mildly dilated. Systolic   function was severely reduced. - Right atrium: The atrium was moderately dilated.  Impressions:  - Low normal to mildly reduced LV systolic function (EF 50); grade   1 diastolic dysfunction; D shaped septum; moderate RAE and mild   RVE; severely reduced RV function; findings suggestive of   pulmonary hypertension.   Discharge Instructions  Discharge Instructions    (HEART FAILURE PATIENTS) Call MD:  Anytime you have any of the following symptoms: 1) 3 pound weight gain in 24 hours or 5 pounds in 1 week 2) shortness of breath, with or without a dry hacking cough 3) swelling in the hands, feet  or stomach 4) if you have to sleep  on extra pillows at night in order to breathe.    Complete by:  As directed    Call MD for:  difficulty breathing, headache or visual disturbances    Complete by:  As directed    Call MD for:  extreme fatigue    Complete by:  As directed    Call MD for:  persistant dizziness or light-headedness    Complete by:  As directed    Call MD for:  persistant nausea and vomiting    Complete by:  As directed    Call MD for:  severe uncontrolled pain    Complete by:  As directed    Call MD for:  temperature >100.4    Complete by:  As directed    Diet - low sodium heart healthy    Complete by:  As directed    Diet Carb Modified    Complete by:  As directed    Discharge instructions    Complete by:  As directed    Continue oxygen as before: 4 L/m at rest and 6 L/m with activity.   Increase activity slowly    Complete by:  As directed        Medication List    STOP taking these medications   pantoprazole 40 MG tablet Commonly known as:  PROTONIX   sitaGLIPtin-metformin 50-1000 MG tablet Commonly known as:  [name]     TAKE these medications   albuterol 108 (90 Base) MCG/ACT inhaler Commonly known as:  PROVENTIL HFA;VENTOLIN HFA Inhale 2 puffs into the lungs every 4 (four) hours as needed for wheezing or shortness of breath.   aspirin 81 MG EC tablet Take 81 mg by mouth daily.   carvedilol 3.125 MG tablet Commonly known as:  COREG Take 3.125 mg by mouth 2 (two) times daily with a meal.   Fluticasone-Salmeterol 250-50 MCG/DOSE Aepb Commonly known as:  ADVAIR DISKUS Inhale 1 puff into the lungs 2 (two) times daily.   furosemide 20 MG tablet Commonly known as:  LASIX Take 2 tablets (40 mg total) by mouth daily. What changed:  how much to take  when to take this  reasons to take this   GENERLAC 10 GM/15ML Soln Generic drug:  lactulose (encephalopathy) Take 10 g by mouth 2 (two) times daily as needed (for constipation).   Insulin  Detemir 100 UNIT/ML Pen Commonly known as:  [name] FLEXPEN Inject 60 Units into the skin 2 (two) times daily. What changed:  how much to take   insulin lispro 100 UNIT/ML KiwkPen Commonly known as:  HUMALOG KWIKPEN Inject 0.25 mLs (25 Units total) into the skin 3 (three) times daily. What changed:  how much to take  when to take this   LINZESS 290 MCG Caps capsule Generic drug:  linaclotide Take 290 mcg by mouth daily before breakfast.   lisinopril 5 MG tablet Commonly known as:  PRINIVIL,ZESTRIL Take 5 mg by mouth daily.   omeprazole 40 MG capsule Commonly known as:  PRILOSEC Take 40 mg by mouth daily.   oxyCODONE-acetaminophen 5-325 MG tablet Commonly known as:  PERCOCET/ROXICET Take 0.5-1 tablets by mouth every 8 (eight) hours as needed (for pain).   polyethylene glycol packet Commonly known as:  MIRALAX / GLYCOLAX Take 17 g by mouth daily as needed for constipation.   potassium chloride SA 20 MEQ tablet Commonly known as:  K-DUR,KLOR-CON Take 1 tablet (20 mEq total) by mouth daily. Start taking on:  10/23/2016   simethicone 80 MG chewable tablet  Commonly known as:  MYLICON Chew 80 mg by mouth every 6 (six) hours as needed for flatulence.   sucralfate 1 g tablet Commonly known as:  CARAFATE Take 1 g by mouth 3 (three) times daily.   umeclidinium bromide 62.5 MCG/INH Aepb Commonly known as:  INCRUSE ELLIPTA Inhale 1 puff into the lungs daily.   Vitamin D (Ergocalciferol) 50000 units Caps capsule Commonly known as:  DRISDOL Take 50,000 Units by mouth every 7 (seven) days.   ZOCOR 40 MG tablet Generic drug:  simvastatin Take 40 mg by mouth daily.      Follow-up Information    [name], MD Follow up on 11/03/2016.   Specialty:  Cardiology Why:  at 1030 for post hospital follow up.  Please bring all of your medications to your visit. The code for parking is 0004. Come in through construction entrance off of [address], [employer] garage is on  right. We are located on 1st floor.  Contact information: [place] [address] [phone]        [name], MD. Schedule an appointment as soon as possible for a visit in 1 week(s).   Specialty:  Pulmonary Disease Contact information: [address] [place] [address] [phone]        PARUCHURI,VAMSEE, MD. Schedule an appointment as soon as possible for a visit in 4 day(s).   Specialty:  Internal Medicine Why:  To be seen with repeat labs (CBC & BMP). Recommend adjusting diabetic medications as needed.         No Known Allergies     Procedures/Studies: Dg Abd 1 View  Result Date: 10/14/2016 CLINICAL DATA:  Bilateral abdominal pain EXAM: ABDOMEN - 1 VIEW COMPARISON:  10/03/2016, 10/02/2016 FINDINGS: Nonspecific nonobstructed gas pattern. No pathologic calcifications. IMPRESSION: Nonspecific nonobstructed gas pattern Electronically Signed   By: Donavan Foil M.D.   On: 10/14/2016 01:57   Ct Angio Chest Pe W And/or Wo Contrast  Result Date: 10/13/2016 CLINICAL DATA:  Initial evaluation for increased oxygen demand, hypoxia. EXAM: CT ANGIOGRAPHY CHEST WITH CONTRAST TECHNIQUE: Multidetector CT imaging of the chest was performed using the standard protocol during bolus administration of intravenous contrast. Multiplanar CT image reconstructions and MIPs were obtained to evaluate the vascular anatomy. CONTRAST:  90 cc of Isovue 370. COMPARISON:  Prior CT from 09/20/2016 as well as previous radiograph from earlier same day. FINDINGS: Cardiovascular: Intrathoracic aorta a grossly normal and of normal caliber. No definite aortic abnormality, although evaluation limited due to timing of the contrast bolus. Cardiomegaly is stable from prior. Right ventricular and right atrial enlargement with straightening of the intraventricular septum. No pericardial effusion. Pulmonary arterial tree adequately opacified for evaluation. Main  pulmonary artery mildly dilated to 3.2 cm and transaxial diameter. No filling defect to suggest acute pulmonary embolism identified. Re-formatted imaging confirms these findings. From the data bowing for possible small distal emboli somewhat limited on CT moved respiratory motion artifact and body habitus. Mediastinum/Nodes: Visualized thyroid is normal. Scattered enlarged mediastinal lymph nodes measure up to approximately 2 cm in short access, not significantly changed relative to recent CT. Prevascular node measures 15 mm, stable. Right hilar node measures 14 mm. Soft tissue fullness at the left hilum with 16 mm left hilar node also similar. Mildly prominent left axillary node measures up to 11 mm, similar to previous. No right axillary adenopathy. Esophagus within normal limits. Lungs/Pleura: Small layering bilateral pleural effusions. Diffuse interlobular septal thickening suggestive of pulmonary edema. 5  mm right upper lobe nodule is stable (series 6, image 32). Additional 10 x 9 mm nodular density within the posterior left upper lobe also stable (series 6, image 34). Unchanged 5 mm subpleural left upper lobe nodule (series 6, image 42). No focal infiltrates. Linear atelectasis within the right middle lobe and lingula. No pneumothorax. Upper Abdomen: Visualized upper abdomen within normal limits. Musculoskeletal: No acute osseous abnormality. No worrisome lytic or blastic osseous lesions. Review of the MIP images confirms the above findings. IMPRESSION: 1. No CT evidence for acute pulmonary embolism. 2. Diffuse interlobular septal thickening, suggesting mild pulmonary interstitial edema. Small layering bilateral pleural effusions. 3. Dilatation of the main pulmonary artery to 3.2 cm, suggesting pulmonary hypertension. Right ventricular and right atrial dilatation with mild straightening of the intraventricular septum, suggestive of elevated right sided heart pressures. Findings are similar relative to recent  CT. 4. Similar appearance of enlarged mediastinal, hilar, and left axillary lymph nodes as above. Again, while these findings may be related to underlying systemic disease such as sarcoidosis, possible lymphoproliferative disorder could also have this appearance. 5. Bilateral pulmonary nodules, stable relative to previous examinations, presumably benign. Electronically Signed   By: Jeannine Boga M.D.   On: 10/13/2016 22:24   Dg Chest Portable 1 View  Result Date: 10/13/2016 CLINICAL DATA:  60 year old female with history of acute on chronic shortness of breath today. Low oxygen saturations. EXAM: PORTABLE CHEST 1 VIEW COMPARISON:  Chest x-ray 09/30/2016. FINDINGS: There is cephalization of the pulmonary vasculature and slight indistinctness of the interstitial markings suggestive of mild pulmonary edema. Mild cardiomegaly trace bilateral pleural effusions. The patient is rotated to the right on today's exam, resulting in distortion of the mediastinal contours and reduced diagnostic sensitivity and specificity for mediastinal pathology. Atherosclerosis in the thoracic aorta. IMPRESSION: 1. The appearance of the chest suggests mild congestive heart failure, as above. Electronically Signed   By: Vinnie Langton M.D.   On: 10/13/2016 17:44      Subjective: Intermittently mildly dizzy-not positional. Ambulating to the bathroom without discomfort. No chest pain. Breathing at baseline. No other complaints reported. Low blood pressures this morning where erroneous and were manually checked (104/74 and 104/68).  Discharge Exam:  Vitals:   10/22/16 1041 10/22/16 1105 10/22/16 1106 10/22/16 1210  BP: (!) 85/50 104/74 104/68 98/71  Pulse: 76   97  Resp:    18  Temp:    97.6 F (36.4 C)  TempSrc:    Oral  SpO2:    90%  Weight:      Height:        General exam: Appears calm and comfortable, no distress. Patient seen along with case management and RN in room. Morbidly obese.  Respiratory  system: Clear to auscultation except occasional basal crackles. No increased work of breathing.  Cardiovascular system: S1 & S2 heard, Rate controlled. No JVD or pedal edema. Telemetry: Sinus rhythm  Gastrointestinal system: (+) BS, non tender abdomen  Central nervous system: No focal neurological deficits. Alert and oriented. Extremities: No swelling, palpable pulses  Skin: Skin is warm and dry  Psychiatry: Normal mood and behavior    The results of significant diagnostics from this hospitalization (including imaging, microbiology, ancillary and laboratory) are listed below for reference.     Microbiology: Recent Results (from the past 240 hour(s))  MRSA PCR Screening     Status: None   Collection Time: 10/14/16  1:55 AM  Result Value Ref Range Status   MRSA by PCR NEGATIVE NEGATIVE  Final    Comment:        The GeneXpert MRSA Assay (FDA approved for NASAL specimens only), is one component of a comprehensive MRSA colonization surveillance program. It is not intended to diagnose MRSA infection nor to guide or monitor treatment for MRSA infections.      Labs: BNP (last 3 results)  Recent Labs  10/13/16 1749  BNP 235.3*   Basic Metabolic Panel:  Recent Labs Lab 10/18/16 0444 10/19/16 0452 10/20/16 0429 10/21/16 0218 10/22/16 0907  NA 137 138 135 135 138  K 4.1 4.6 4.5 4.9 4.0  CL 98* 100* 95* 98* 102  CO2 28 29 30 27 27   GLUCOSE 408* 338* 344* 254* 195*  BUN 20 23* 25* 22* 14  CREATININE 1.01* 0.80 0.89 0.84 0.78  CALCIUM 9.4 9.7 9.6 9.5 9.4   Liver Function Tests: No results for input(s): AST, ALT, ALKPHOS, BILITOT, PROT, ALBUMIN in the last 168 hours. No results for input(s): LIPASE, AMYLASE in the last 168 hours. No results for input(s): AMMONIA in the last 168 hours. CBC:  Recent Labs Lab 10/19/16 0452 10/20/16 0429 10/21/16 0218  WBC 8.9 9.8 9.2  HGB 13.6 14.4 13.6  HCT 45.6 48.1* 45.8  MCV 88.0 88.4 86.9  PLT 281 306 314   Cardiac  Enzymes: No results for input(s): CKTOTAL, CKMB, CKMBINDEX, TROPONINI in the last 168 hours. BNP: Invalid input(s): POCBNP CBG:  Recent Labs Lab 10/21/16 1156 10/21/16 1631 10/21/16 2253 10/22/16 0642 10/22/16 1141  GLUCAP 340* 269* 169* 240* 197*   Urinalysis    Component Value Date/Time   COLORURINE YELLOW 10/13/2016 0002   APPEARANCEUR CLEAR 10/13/2016 0002   LABSPEC 1.010 10/13/2016 0002   PHURINE 5.5 10/13/2016 0002   GLUCOSEU NEGATIVE 10/13/2016 0002   HGBUR NEGATIVE 10/13/2016 0002   BILIRUBINUR NEGATIVE 10/13/2016 0002   KETONESUR NEGATIVE 10/13/2016 0002   PROTEINUR NEGATIVE 10/13/2016 0002   NITRITE NEGATIVE 10/13/2016 0002   LEUKOCYTESUR NEGATIVE 10/13/2016 0002   Additional lab results/autoimmune workup:  Histoplasma antigen, urine: Pending  Anticentromere B antibodies: Negative/<0.2  Cryptococcal antigen: Negative  SCL-70: <0.2/negative  History antibodies: 0.3/negative  Ds DNA antibody: 1/negative  Anti-Smith antibody: <0.2/negative  ANA: Negative  CRP: 1.5, ESR: 14  SSB: <0.2/negative  SSA: <0.2/negative  CCP antibodies: 6/negative  Rheumatoid factor <10/negative   Time coordinating discharge: Over 30 minutes  SIGNED:  Vernell Leep, MD, FACP, FHM. [facility] Pager [phone]  If 7PM-7AM, please contact night-coverage [email or web address] Password [email or web address] 10/22/2016, 4:10 PM

## 2016-10-22 NOTE — Discharge Instructions (Signed)
Acute respiratory failure Respiratory failure is when your lungs are not working well and your breathing (respiratory) system fails. When respiratory failure occurs, it is difficult for your lungs to get enough oxygen, get rid of carbon dioxide, or both. Respiratory failure can be life threatening.  Respiratory failure can be acute or chronic. Acute respiratory failure is sudden, severe, and requires emergency medical treatment. Chronic respiratory failure is less severe, happens over time, and requires ongoing treatment.  WHAT ARE THE CAUSES OF ACUTE RESPIRATORY FAILURE?  Any problem affecting the heart or lungs can cause acute respiratory failure. Some of these causes include the following:  Chronic bronchitis and emphysema (COPD).   Blood clot going to a lung (pulmonary embolism).   Having water in the lungs caused by heart failure, lung injury, or infection (pulmonary edema).   Collapsed lung (pneumothorax).   Pneumonia.   Pulmonary fibrosis.   Obesity.   Asthma.   Heart failure.   Any type of trauma to the chest that can make breathing difficult.   Nerve or muscle diseases making chest movements difficult. HOW WILL MY ACUTE RESPIRATORY FAILURE BE TREATED?  Treatment of acute respiratory failure depends on the cause of the respiratory failure. Usually, you will stay in the intensive care unit so your breathing can be watched closely. Treatment can include the following:  Oxygen. Oxygen can be delivered through the following:  Nasal cannula. This is small tubing that goes in your nose to give you oxygen.  Face mask. A face mask covers your nose and mouth to give you oxygen.  Medicine. Different medicines can be given to help with breathing. These can include:  Nebulizers. Nebulizers deliver medicines to open the air passages (bronchodilators). These medicines help to open or relax the airways in the lungs so you can breathe better. They can also help loosen mucus  from your lungs.  Diuretics. Diuretic medicines can help you breathe better by getting rid of extra water in your body.  Steroids. Steroid medicines can help decrease swelling (inflammation) in your lungs.  Antibiotics.  Chest tube. If you have a collapsed lung (pneumothorax), a chest tube is placed to help reinflate the lung.  Noninvasive positive pressure ventilation (NPPV). This is a tight-fitting mask that goes over your nose and mouth. The mask has tubing that is attached to a machine. The machine blows air into the tubing, which helps to keep the tiny air sacs (alveoli) in your lungs open. This machine allows you to breathe on your own.  Ventilator. A ventilator is a breathing machine. When on a ventilator, a breathing tube is put into the lungs. A ventilator is used when you can no longer breathe well enough on your own. You may have low oxygen levels or high carbon dioxide (CO2) levels in your blood. When you are on a ventilator, sedation and pain medicines are given to make you sleep so your lungs can heal. SEEK IMMEDIATE MEDICAL CARE IF:  You have shortness of breath (dyspnea) with or without activity.  You have rapid breathing (tachypnea).  You are wheezing.  You are unable to say more than a few words without having to catch your breath.  You find it very difficult to function normally.  You have a fast heart rate.  You have a bluish color to your finger or toe nail beds.  You have confusion or drowsiness or both.   This information is not intended to replace advice given to you by your health care provider. Make  sure you discuss any questions you have with your health care provider.   Document Released: 12/12/2013 Document Revised: 08/28/2015 Document Reviewed: 12/12/2013 Elsevier Interactive Patient Education 2016 [employer]    Heart Failure Heart failure is a condition in which the heart has trouble pumping blood. This means your heart does not pump blood  efficiently for your body to work well. In some cases of heart failure, fluid may back up into your lungs or you may have swelling (edema) in your lower legs. Heart failure is usually a long-term (chronic) condition. It is important for you to take good care of yourself and follow your health care provider's treatment plan. CAUSES  Some health conditions can cause heart failure. Those health conditions include:  High blood pressure (hypertension). Hypertension causes the heart muscle to work harder than normal. When pressure in the blood vessels is high, the heart needs to pump (contract) with more force in order to circulate blood throughout the body. High blood pressure eventually causes the heart to become stiff and weak.  Coronary artery disease (CAD). CAD is the buildup of cholesterol and fat (plaque) in the arteries of the heart. The blockage in the arteries deprives the heart muscle of oxygen and blood. This can cause chest pain and may lead to a heart attack. High blood pressure can also contribute to CAD.  Heart attack (myocardial infarction). A heart attack occurs when one or more arteries in the heart become blocked. The loss of oxygen damages the muscle tissue of the heart. When this happens, part of the heart muscle dies. The injured tissue does not contract as well and weakens the heart's ability to pump blood.  Abnormal heart valves. When the heart valves do not open and close properly, it can cause heart failure. This makes the heart muscle pump harder to keep the blood flowing.  Heart muscle disease (cardiomyopathy or myocarditis). Heart muscle disease is damage to the heart muscle from a variety of causes. These can include drug or alcohol abuse, infections, or unknown reasons. These can increase the risk of heart failure.  Lung disease. Lung disease makes the heart work harder because the lungs do not work properly. This can cause a strain on the heart, leading it to  fail.  Diabetes. Diabetes increases the risk of heart failure. High blood sugar contributes to high fat (lipid) levels in the blood. Diabetes can also cause slow damage to tiny blood vessels that carry important nutrients to the heart muscle. When the heart does not get enough oxygen and food, it can cause the heart to become weak and stiff. This leads to a heart that does not contract efficiently.  Other conditions can contribute to heart failure. These include abnormal heart rhythms, thyroid problems, and low blood counts (anemia). Certain unhealthy behaviors can increase the risk of heart failure, including:  Being overweight.  Smoking or chewing tobacco.  Eating foods high in fat and cholesterol.  Abusing illicit drugs or alcohol.  Lacking physical activity. SYMPTOMS  Heart failure symptoms may vary and can be hard to detect. Symptoms may include:  Shortness of breath with activity, such as climbing stairs.  Persistent cough.  Swelling of the feet, ankles, legs, or abdomen.  Unexplained weight gain.  Difficulty breathing when lying flat (orthopnea).  Waking from sleep because of the need to sit up and get more air.  Rapid heartbeat.  Fatigue and loss of energy.  Feeling light-headed, dizzy, or close to fainting.  Loss of appetite.  Nausea.  Increased urination during the night (nocturia). DIAGNOSIS  A diagnosis of heart failure is based on your history, symptoms, physical examination, and diagnostic tests. Diagnostic tests for heart failure may include:  Echocardiography.  Electrocardiography.  Chest X-ray.  Blood tests.  Exercise stress test.  Cardiac angiography.  Radionuclide scans. TREATMENT  Treatment is aimed at managing the symptoms of heart failure. Medicines, behavioral changes, or surgical intervention may be necessary to treat heart failure.  Medicines to help treat heart failure may include:  Angiotensin-converting enzyme (ACE)  inhibitors. This type of medicine blocks the effects of a blood protein called angiotensin-converting enzyme. ACE inhibitors relax (dilate) the blood vessels and help lower blood pressure.  Angiotensin receptor blockers (ARBs). This type of medicine blocks the actions of a blood protein called angiotensin. Angiotensin receptor blockers dilate the blood vessels and help lower blood pressure.  Water pills (diuretics). Diuretics cause the kidneys to remove salt and water from the blood. The extra fluid is removed through urination. This loss of extra fluid lowers the volume of blood the heart pumps.  Beta blockers. These prevent the heart from beating too fast and improve heart muscle strength.  Digitalis. This increases the force of the heartbeat.  Healthy behavior changes include:  Obtaining and maintaining a healthy weight.  Stopping smoking or chewing tobacco.  Eating heart-healthy foods.  Limiting or avoiding alcohol.  Stopping illicit drug use.  Physical activity as directed by your health care provider.  Surgical treatment for heart failure may include:  A procedure to open blocked arteries, repair damaged heart valves, or remove damaged heart muscle tissue.  A pacemaker to improve heart muscle function and control certain abnormal heart rhythms.  An internal cardioverter defibrillator to treat certain serious abnormal heart rhythms.  A left ventricular assist device (LVAD) to assist the pumping ability of the heart. HOME CARE INSTRUCTIONS   Take medicines only as directed by your health care provider. Medicines are important in reducing the workload of your heart, slowing the progression of heart failure, and improving your symptoms.  Do not stop taking your medicine unless directed by your health care provider.  Do not skip any dose of medicine.  Refill your prescriptions before you run out of medicine. Your medicines are needed every day.  Engage in moderate physical  activity if directed by your health care provider. Moderate physical activity can benefit some people. The elderly and people with severe heart failure should consult with a health care provider for physical activity recommendations.  Eat heart-healthy foods. Food choices should be free of trans fat and low in saturated fat, cholesterol, and salt (sodium). Healthy choices include fresh or frozen fruits and vegetables, fish, lean meats, legumes, fat-free or low-fat dairy products, and whole grain or high fiber foods. Talk to a dietitian to learn more about heart-healthy foods.  Limit sodium if directed by your health care provider. Sodium restriction may reduce symptoms of heart failure in some people. Talk to a dietitian to learn more about heart-healthy seasonings.  Use healthy cooking methods. Healthy cooking methods include roasting, grilling, broiling, baking, poaching, steaming, or stir-frying. Talk to a dietitian to learn more about healthy cooking methods.  Limit fluids if directed by your health care provider. Fluid restriction may reduce symptoms of heart failure in some people.  Weigh yourself every day. Daily weights are important in the early recognition of excess fluid. You should weigh yourself every morning after you urinate and before you eat breakfast. Wear the same  amount of clothing each time you weigh yourself. Record your daily weight. Provide your health care provider with your weight record.  Monitor and record your blood pressure if directed by your health care provider.  Check your pulse if directed by your health care provider.  Lose weight if directed by your health care provider. Weight loss may reduce symptoms of heart failure in some people.  Stop smoking or chewing tobacco. Nicotine makes your heart work harder by causing your blood vessels to constrict. Do not use nicotine gum or patches before talking to your health care provider.  Keep all follow-up visits as  directed by your health care provider. This is important.  Limit alcohol intake to no more than 1 drink per day for nonpregnant women and 2 drinks per day for men. One drink equals 12 ounces of beer, 5 ounces of wine, or 1 ounces of hard liquor. Drinking more than that is harmful to your heart. Tell your health care provider if you drink alcohol several times a week. Talk with your health care provider about whether alcohol is safe for you. If your heart has already been damaged by alcohol or you have severe heart failure, drinking alcohol should be stopped completely.  Stop illicit drug use.  Stay up-to-date with immunizations. It is especially important to prevent respiratory infections through current pneumococcal and influenza immunizations.  Manage other health conditions such as hypertension, diabetes, thyroid disease, or abnormal heart rhythms as directed by your health care provider.  Learn to manage stress.  Plan rest periods when fatigued.  Learn strategies to manage high temperatures. If the weather is extremely hot:  Avoid vigorous physical activity.  Use air conditioning or fans or seek a cooler location.  Avoid caffeine and alcohol.  Wear loose-fitting, lightweight, and light-colored clothing.  Learn strategies to manage cold temperatures. If the weather is extremely cold:  Avoid vigorous physical activity.  Layer clothes.  Wear mittens or gloves, a hat, and a scarf when going outside.  Avoid alcohol.  Obtain ongoing education and support as needed.  Participate in or seek rehabilitation as needed to maintain or improve independence and quality of life. SEEK MEDICAL CARE IF:   You have a rapid weight gain.  You have increasing shortness of breath that is unusual for you.  You are unable to participate in your usual physical activities.  You tire easily.  You cough more than normal, especially with physical activity.  You have any or more swelling in  areas such as your hands, feet, ankles, or abdomen.  You are unable to sleep because it is hard to breathe.  You feel like your heart is beating fast (palpitations).  You become dizzy or light-headed upon standing up. SEEK IMMEDIATE MEDICAL CARE IF:   You have difficulty breathing.  There is a change in mental status such as decreased alertness or difficulty with concentration.  You have a pain or discomfort in your chest.  You have an episode of fainting (syncope). MAKE SURE YOU:   Understand these instructions.  Will watch your condition.  Will get help right away if you are not doing well or get worse.   This information is not intended to replace advice given to you by your health care provider. Make sure you discuss any questions you have with your health care provider.   Document Released: 12/07/2005 Document Revised: 04/23/2015 Document Reviewed: 01/06/2013 Elsevier Interactive Patient Education [employer].

## 2016-10-23 NOTE — Progress Notes (Addendum)
Received a call from lab that they were unable to perform fungitell lab test due to it not being transported on ice. Dr. Allyson Sabal was paged. 10/23/16 at 1703

## 2016-10-31 LAB — HYPERSENSITIVITY PNEUMONITIS
A. FUMIGATUS #1 ABS: NEGATIVE
A. Pullulans Abs: NEGATIVE
MICROPOLYSPORA FAENI IGG: NEGATIVE
Pigeon Serum Abs: NEGATIVE
THERMOACT. SACCHARII: NEGATIVE
Thermoactinomyces vulgaris, IgG: NEGATIVE

## 2016-10-31 LAB — SJOGRENS SYNDROME-A EXTRACTABLE NUCLEAR ANTIBODY

## 2016-10-31 LAB — ANTINUCLEAR ANTIBODIES, IFA: ANTINUCLEAR ANTIBODIES, IFA: NEGATIVE

## 2016-10-31 LAB — RHEUMATOID FACTOR: Rhuematoid fact SerPl-aCnc: <10 IU/mL (ref 0.0–13.9)

## 2016-10-31 LAB — SJOGRENS SYNDROME-B EXTRACTABLE NUCLEAR ANTIBODY

## 2016-10-31 LAB — ANTI-SCLERODERMA ANTIBODY: Scleroderma (Scl-70) (ENA) Antibody, IgG: <0.2 AI (ref 0.0–0.9)

## 2016-10-31 LAB — CYCLIC CITRUL PEPTIDE ANTIBODY, IGG/IGA: CCP Antibodies IgG/IgA: 6 units (ref 0–19)

## 2016-10-31 LAB — ANTI-DNA ANTIBODY, DOUBLE-STRANDED: ds DNA Ab: <1 IU/mL (ref 0–9)

## 2016-10-31 LAB — ANTI-SMITH ANTIBODY

## 2016-10-31 LAB — HISTONE ANTIBODIES, IGG, BLOOD: DNA-Histone: 0.3 Units (ref 0.0–0.9)

## 2016-11-03 ENCOUNTER — Inpatient Hospital Stay (HOSPITAL_COMMUNITY): Payer: BLUE CROSS/BLUE SHIELD

## 2017-02-18 DEATH — deceased

## 2017-07-22 IMAGING — CT CT ANGIO CHEST
2 of 8 series · 17 of 46 positions shown · IV contrast (isovue)
Comparison: Prior CT from 09/20/2016 as well as previous radiograph
from earlier same day.

CLINICAL DATA: Initial evaluation for increased oxygen demand,
hypoxia.

EXAM:
CT ANGIOGRAPHY CHEST WITH CONTRAST
TECHNIQUE: Multidetector CT imaging of the chest was performed using the
standard protocol during bolus administration of intravenous
contrast. Multiplanar CT image reconstructions and MIPs were
obtained to evaluate the vascular anatomy.
CONTRAST:  90 cc of Isovue 370.

[Series 5: thins · axial · 0.73mm/px · z∈[+1682,+1924]mm · 14 of 268 slices shown]
[im 13/268  lung]
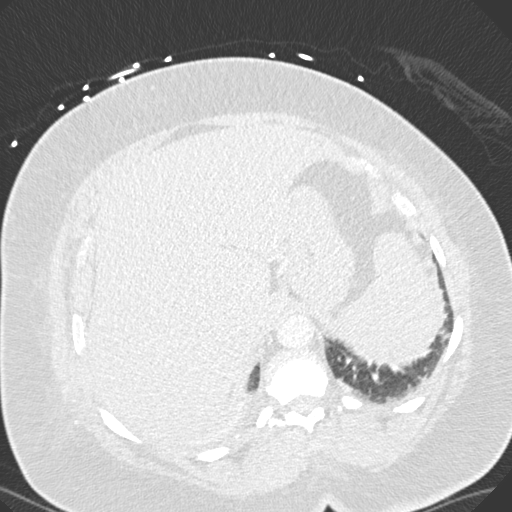
[im 37/268  soft-tissue]
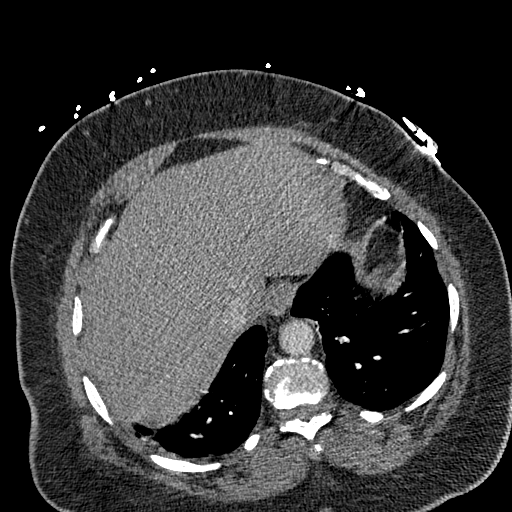
[im 49/268  lung]
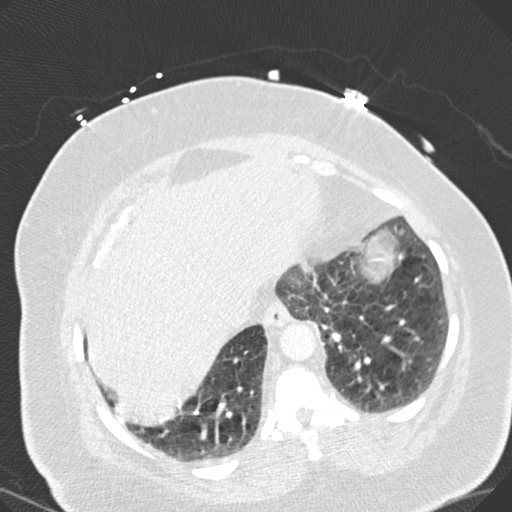
[im 73/268  soft-tissue]
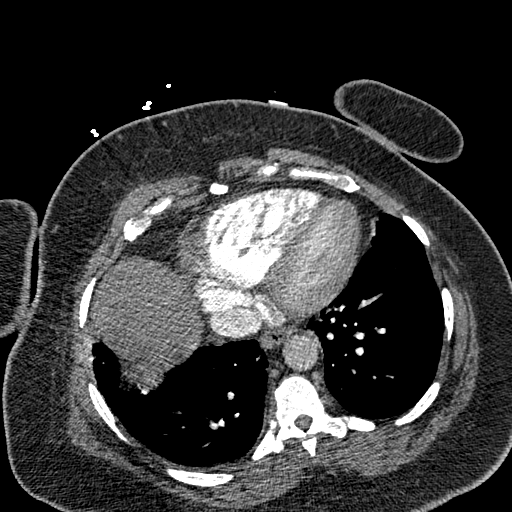
[im 85/268  lung]
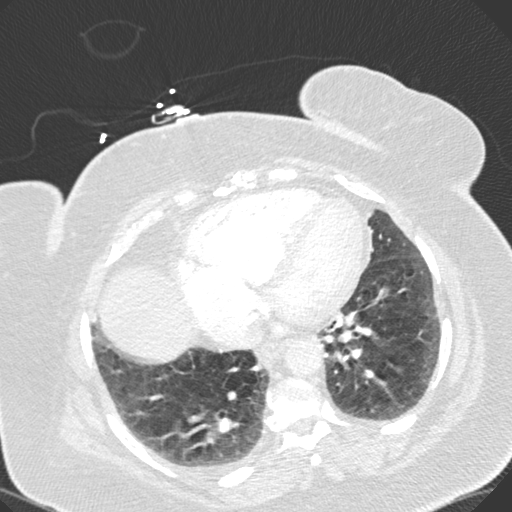
[im 110/268  soft-tissue]
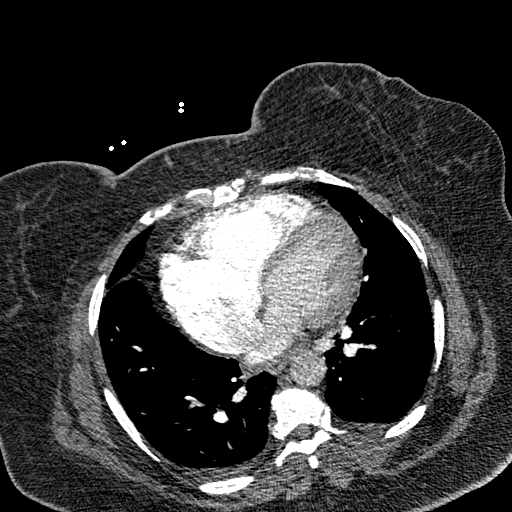
[im 122/268  lung]
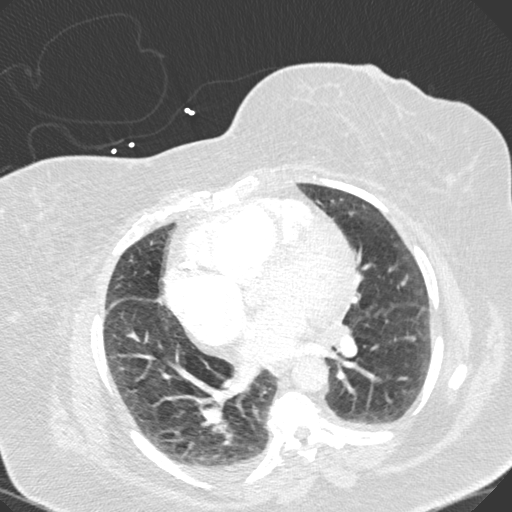
[im 146/268  soft-tissue]
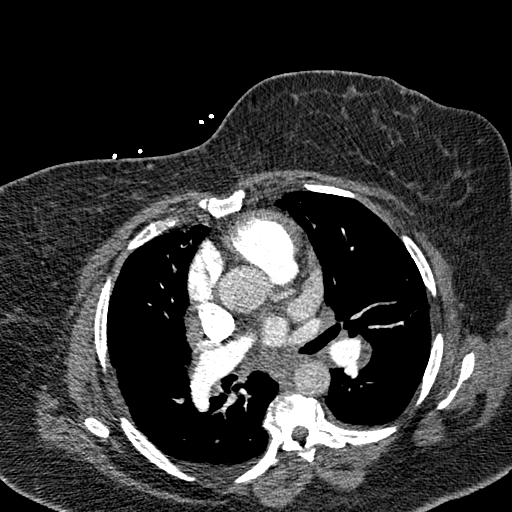
[im 158/268  lung]
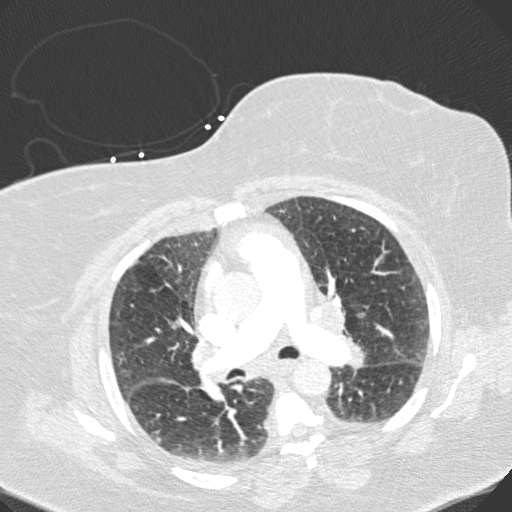
[im 183/268  soft-tissue]
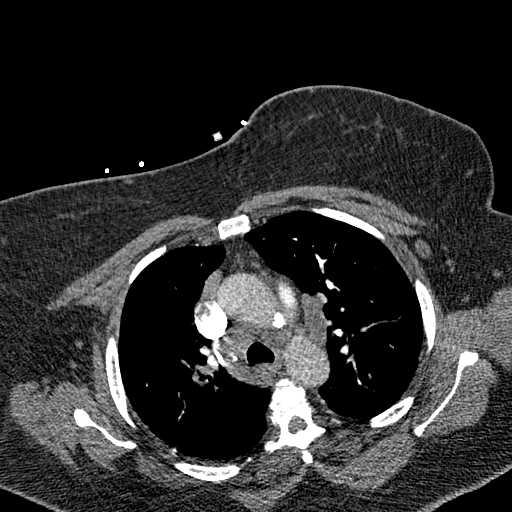
[im 195/268  lung]
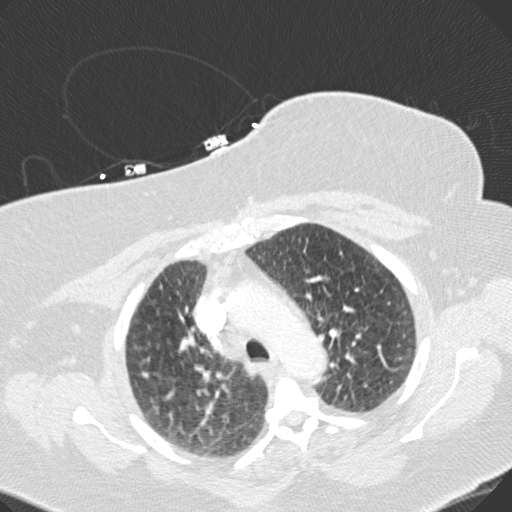
[im 219/268  soft-tissue]
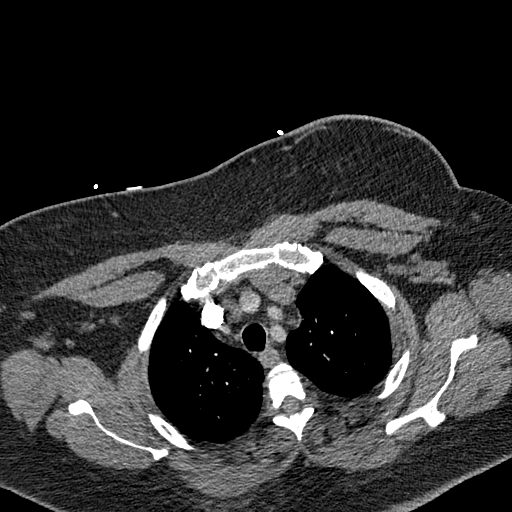
[im 231/268  lung]
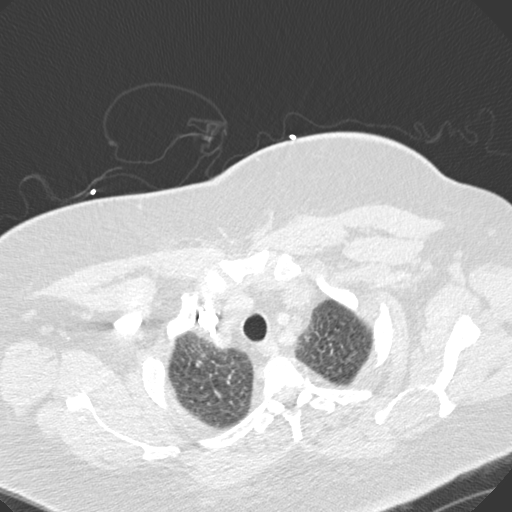
[im 255/268  soft-tissue]
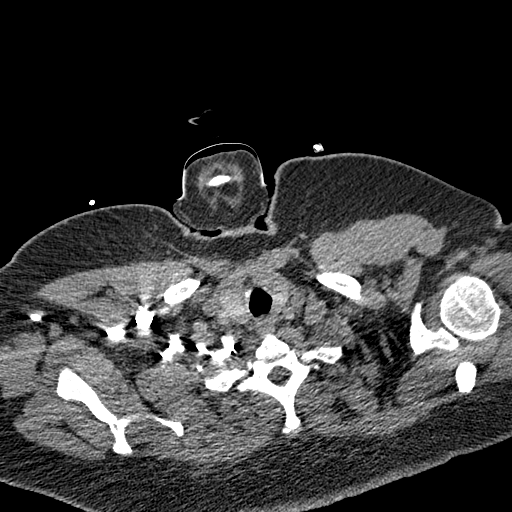

[Series 7: coronal mpr · coronal · 0.51mm/px · 3 of 151 slices shown]
[im 38/151  soft-tissue]
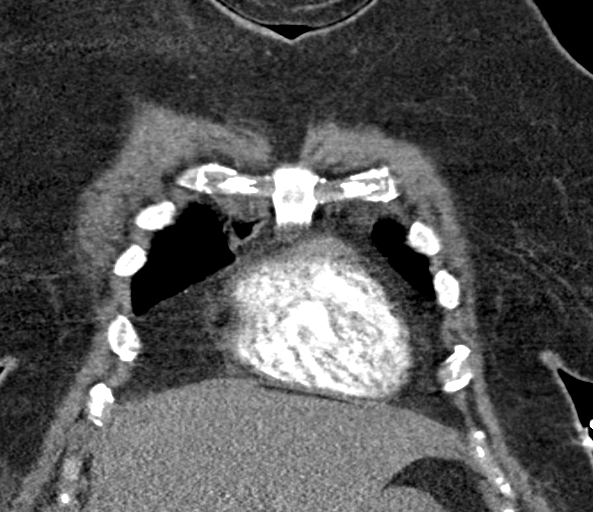
[im 76/151  soft-tissue]
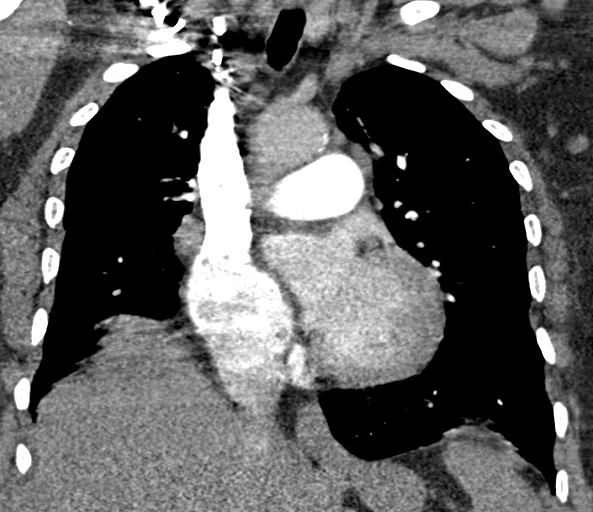
[im 113/151  soft-tissue]
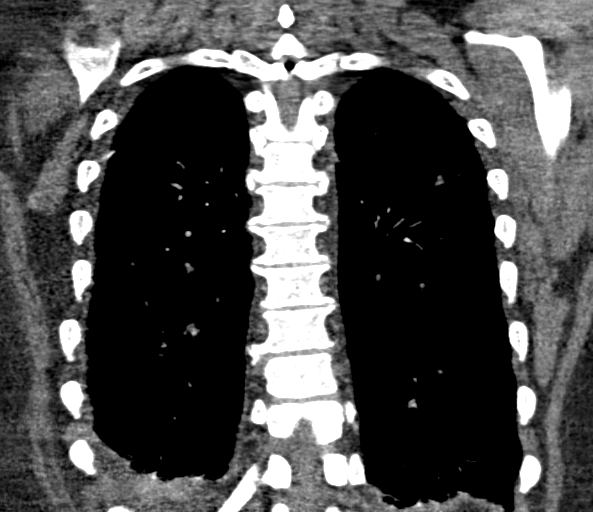

[17 of 46 positions shown; findings below may reference images not displayed]

FINDINGS: Cardiovascular: Intrathoracic aorta a grossly normal and of normal
caliber. No definite aortic abnormality, although evaluation limited
due to timing of the contrast bolus.

Cardiomegaly is stable from prior. Right ventricular and right
atrial enlargement with straightening of the intraventricular
septum. No pericardial effusion.

Pulmonary arterial tree adequately opacified for evaluation. Main
pulmonary artery mildly dilated to 3.2 cm and transaxial diameter.
No filling defect to suggest acute pulmonary embolism identified.
Re-formatted imaging confirms these findings. From the data bowing
for possible small distal emboli somewhat limited on CT moved
respiratory motion artifact and body habitus.

Mediastinum/Nodes: Visualized thyroid is normal. Scattered enlarged
mediastinal lymph nodes measure up to approximately 2 cm in short
access, not significantly changed relative to recent CT. Prevascular
node measures 15 mm, stable. Right hilar node measures 14 mm. Soft
tissue fullness at the left hilum with 16 mm left hilar node also
similar. Mildly prominent left axillary node measures up to 11 mm,
similar to previous. No right axillary adenopathy. Esophagus within
normal limits.

Lungs/Pleura: Small layering bilateral pleural effusions. Diffuse
interlobular septal thickening suggestive of pulmonary edema. 5 mm
right upper lobe nodule is stable (series 6, image 32). Additional
10 x 9 mm nodular density within the posterior left upper lobe also
stable (series 6, image 34). Unchanged 5 mm subpleural left upper
lobe nodule (series 6, image 42). No focal infiltrates. Linear
atelectasis within the right middle lobe and lingula. No
pneumothorax.

Upper Abdomen: Visualized upper abdomen within normal limits.

Musculoskeletal: No acute osseous abnormality. No worrisome lytic or
blastic osseous lesions.

Review of the MIP images confirms the above findings.
IMPRESSION: 1. No CT evidence for acute pulmonary embolism.
2. Diffuse interlobular septal thickening, suggesting mild pulmonary
interstitial edema. Small layering bilateral pleural effusions.
3. Dilatation of the main pulmonary artery to 3.2 cm, suggesting
pulmonary hypertension. Right ventricular and right atrial
dilatation with mild straightening of the intraventricular septum,
suggestive of elevated right sided heart pressures. Findings are
similar relative to recent CT.
4. Similar appearance of enlarged mediastinal, hilar, and left
axillary lymph nodes as above. Again, while these findings may be
related to underlying systemic disease such as sarcoidosis, possible
lymphoproliferative disorder could also have this appearance.
5. Bilateral pulmonary nodules, stable relative to previous
examinations, presumably benign.

## 2017-07-23 IMAGING — DX DG ABDOMEN 1V
2 series · 2 of 2 positions shown · non-contrast
Comparison: 10/03/2016, 10/02/2016

CLINICAL DATA: Bilateral abdominal pain

EXAM:
ABDOMEN - 1 VIEW

[t abdomen supine (1 of 2)]
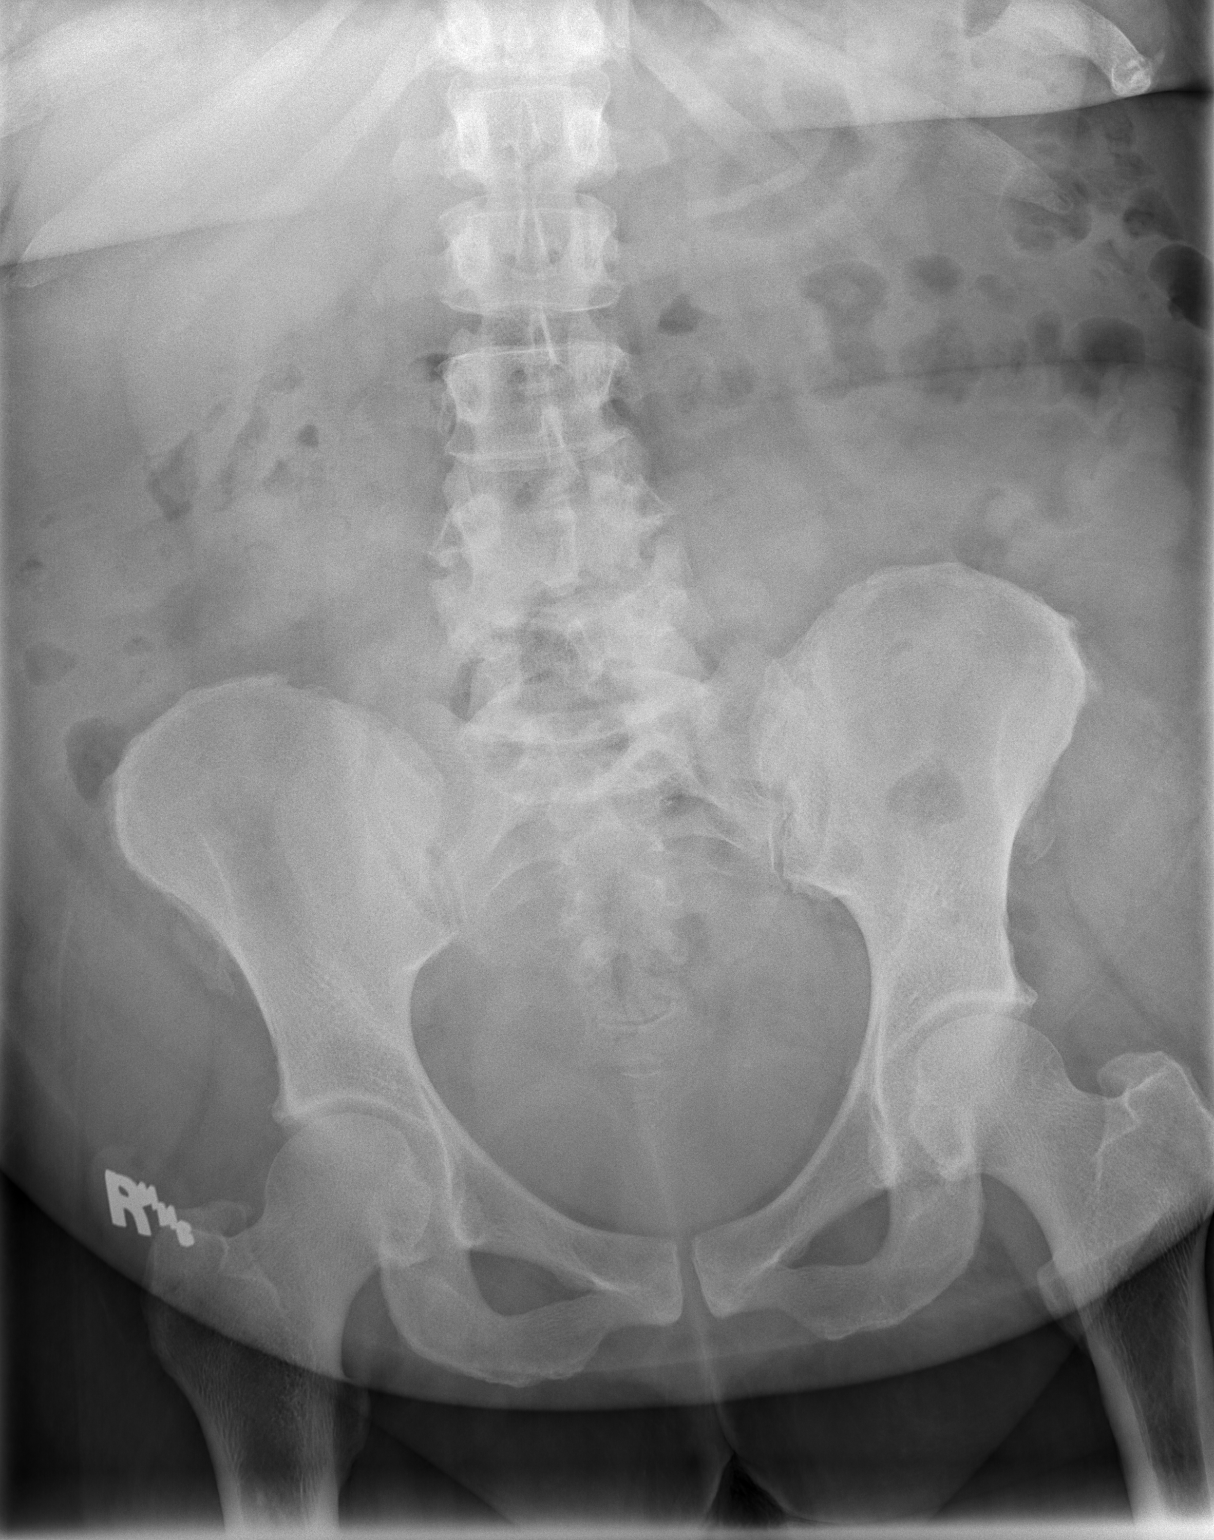

[t abdomen supine (2 of 2)]
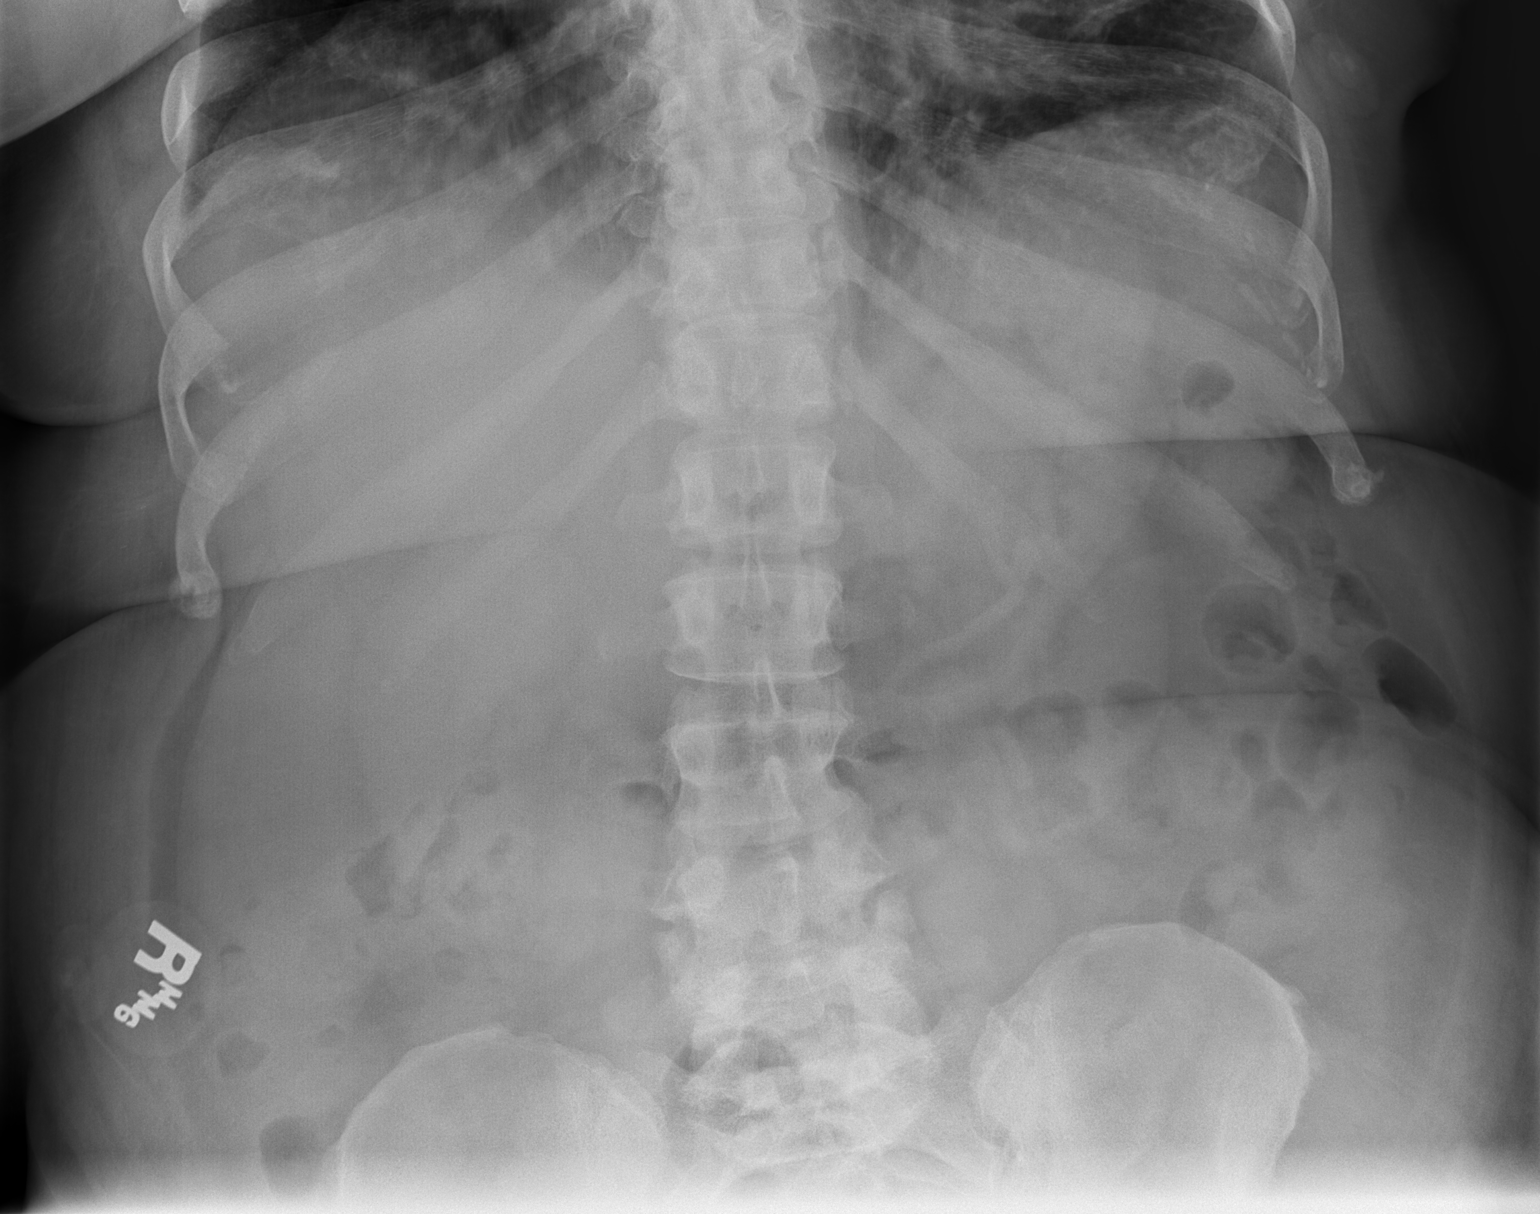

[2 of 2 positions shown; findings below may reference images not displayed]

FINDINGS: Nonspecific nonobstructed gas pattern. No pathologic calcifications.
IMPRESSION: Nonspecific nonobstructed gas pattern

## 2022-11-24 ENCOUNTER — Other Ambulatory Visit: Payer: Self-pay
